# Patient Record
Sex: Female | Born: 1935 | Race: White | Hispanic: No | Smoking: Never smoker
Health system: Southern US, Community
[De-identification: ages and names within clinical notes are randomized; demographics above are authoritative.]

## PROBLEM LIST (undated history)

## (undated) DIAGNOSIS — I4891 Unspecified atrial fibrillation: Secondary | ICD-10-CM

## (undated) DIAGNOSIS — C50919 Malignant neoplasm of unspecified site of unspecified female breast: Secondary | ICD-10-CM

## (undated) DIAGNOSIS — I10 Essential (primary) hypertension: Secondary | ICD-10-CM

## (undated) DIAGNOSIS — E785 Hyperlipidemia, unspecified: Secondary | ICD-10-CM

## (undated) DIAGNOSIS — H409 Unspecified glaucoma: Secondary | ICD-10-CM

## (undated) HISTORY — PX: ABDOMINAL HYSTERECTOMY: SHX81

---

## 2001-04-20 DIAGNOSIS — C50919 Malignant neoplasm of unspecified site of unspecified female breast: Secondary | ICD-10-CM

## 2001-04-20 HISTORY — DX: Malignant neoplasm of unspecified site of unspecified female breast: C50.919

## 2001-04-20 HISTORY — PX: MASTECTOMY: SHX3

## 2007-05-01 ENCOUNTER — Ambulatory Visit: Payer: Self-pay | Admitting: Family Medicine

## 2009-06-09 ENCOUNTER — Ambulatory Visit: Payer: Self-pay | Admitting: Internal Medicine

## 2010-11-16 ENCOUNTER — Ambulatory Visit: Payer: Self-pay | Admitting: Internal Medicine

## 2011-02-16 ENCOUNTER — Inpatient Hospital Stay: Payer: Self-pay | Admitting: Specialist

## 2011-03-20 ENCOUNTER — Encounter: Payer: Self-pay | Admitting: Nurse Practitioner

## 2011-03-20 ENCOUNTER — Encounter: Payer: Self-pay | Admitting: Cardiothoracic Surgery

## 2011-04-17 ENCOUNTER — Encounter: Payer: Self-pay | Admitting: Cardiothoracic Surgery

## 2011-04-17 ENCOUNTER — Encounter: Payer: Self-pay | Admitting: Nurse Practitioner

## 2011-11-20 ENCOUNTER — Ambulatory Visit: Payer: Self-pay | Admitting: Internal Medicine

## 2012-11-20 ENCOUNTER — Ambulatory Visit: Payer: Self-pay | Admitting: Internal Medicine

## 2012-12-11 ENCOUNTER — Observation Stay: Payer: Self-pay | Admitting: Internal Medicine

## 2012-12-11 LAB — URINALYSIS, COMPLETE
Bacteria: NONE SEEN
Bilirubin,UR: NEGATIVE
Ketone: NEGATIVE
Protein: 100
Specific Gravity: 1.005 (ref 1.003–1.030)
WBC UR: 13 /HPF (ref 0–5)

## 2012-12-11 LAB — COMPREHENSIVE METABOLIC PANEL
Albumin: 3.5 g/dL (ref 3.4–5.0)
Anion Gap: 4 — ABNORMAL LOW (ref 7–16)
BUN: 17 mg/dL (ref 7–18)
Calcium, Total: 9.1 mg/dL (ref 8.5–10.1)
Co2: 32 mmol/L (ref 21–32)
Creatinine: 0.63 mg/dL (ref 0.60–1.30)
EGFR (African American): >60
EGFR (Non-African Amer.): >60
Glucose: 129 mg/dL — ABNORMAL HIGH (ref 65–99)
SGOT(AST): 30 U/L (ref 15–37)
SGPT (ALT): 25 U/L (ref 12–78)
Sodium: 135 mmol/L — ABNORMAL LOW (ref 136–145)
Total Protein: 8 g/dL (ref 6.4–8.2)

## 2012-12-11 LAB — CBC
HGB: 14.7 g/dL (ref 12.0–16.0)
MCH: 32.6 pg (ref 26.0–34.0)
MCHC: 33.8 g/dL (ref 32.0–36.0)
MCV: 96 fL (ref 80–100)
Platelet: 198 10*3/uL (ref 150–440)
RBC: 4.52 10*6/uL (ref 3.80–5.20)
RDW: 13.1 % (ref 11.5–14.5)
WBC: 9.2 10*3/uL (ref 3.6–11.0)

## 2012-12-11 LAB — PRO B NATRIURETIC PEPTIDE: B-Type Natriuretic Peptide: 1450 pg/mL — ABNORMAL HIGH (ref 0–450)

## 2012-12-12 LAB — LIPID PANEL
HDL Cholesterol: 46 mg/dL (ref 40–60)
Ldl Cholesterol, Calc: 64 mg/dL (ref 0–100)
VLDL Cholesterol, Calc: 17 mg/dL (ref 5–40)

## 2012-12-12 LAB — PROTIME-INR: Prothrombin Time: 25.3 secs — ABNORMAL HIGH (ref 11.5–14.7)

## 2012-12-12 LAB — TROPONIN I: Troponin-I: 0.11 ng/mL — ABNORMAL HIGH

## 2012-12-14 LAB — URINE CULTURE

## 2014-08-06 NOTE — Discharge Summary (Signed)
PATIENT NAME:  Kathryn [name], Kathryn [name] MR#:  [id number] DATE OF BIRTH:  [birth year]  PRIMARY CARE PHYSICIAN: Ezequiel Kayser, MD.  FINAL DIAGNOSES:  1.  Vertigo.  2.  Accelerated hypertension on presentation.  3.  Chronic atrial fibrillation.  4.  Hyperlipidemia.  5.  Elevated troponin.  6.  Glaucoma.   MEDICATIONS ON DISCHARGE: Include Klor-Con 20 mEq daily, multivitamin 1 tablet at bedtime, hydrochlorothiazide and lisinopril 12.5/20, one tablet daily, diltiazem 300 mg extended-release daily, digoxin 125 mcg daily, ferrous sulfate 325 mg twice a day, calcium with vitamin D 600 mg 200 international units 1 tablet daily, warfarin 2.5 mg one tablet on Tuesday, Thursday and Saturday and 5 mg on Monday, Wednesday, Friday and Sunday. Simvastatin 10 mg at bedtime, timolol ophthalmic solution 0.5% one drop each eye daily, latanoprost 0.005% one drop each eye at bedtime, ICaps 1 capsule twice a day, alendronate 70 mg once a week.  DIET: Low sodium diet, regular consistency.   ACTIVITY: As tolerated.   FOLLOW-UP: With Dr. Raechel Ache in 1 to 2 weeks.   The patient came in with dizziness, found to have elevated blood pressure.   LABORATORY AND RADIOLOGICAL DATA DURING HOSPITAL COURSE: Included a BNP of 1450. Troponin 0.09, glucose 129, BUN 17, creatinine 0.63, sodium 135, potassium 3.5, chloride 99, CO2 of 32, calcium 9.1. Liver function tests normal range. White blood cell count 9.2, H and H 14.7 and 43.5, platelet count 198.   Chest x-ray showed interstitial prominence, underlying component of pulmonary fibrosis.   Urinalysis shows + leukocyte esterase, negative nitrites.   CT scan of the head: No acute intracranial process.   Troponin borderline at 0.11. LDL 64, HDL 46, triglycerides 86.   MRA of the brain: No acute stenosis or aneurysm.   Troponin borderline at 0.11.   INR upon discharge 2.4.   HOSPITAL COURSE PER PROBLEM LIST:  1.  For the patient's vertigo, this had resolved pretty quickly, and the  patient did walk in the room with me without falling over and no further symptoms. No further work-up needed. This is not a stroke. This is not near syncope.  2.  Accelerated hypertension, probably because of the way she was feeling when she came in. Blood pressure better upon discharge on her usual medications. No changes in medications were made. Blood pressure 152/73.  3.  Chronic atrial fibrillation. Rate controlled with diltiazem and digoxin on Coumadin, and level is therapeutic.  4.  Hyperlipidemia. On simvastatin.  5.  Elevated troponin. No complaints of chest pain or shortness of breath. Unclear why her troponin was done in the first place. The patient was given an aspirin on admission. No need for beta blocker since the patient is already on 2 rate-controlling medications, diltiazem and digoxin, and I would not continue an aspirin in this patient since she is already on Coumadin. 6.  Glaucoma. The patient is on timolol and latanoprost.   TIME SPENT ON DISCHARGE: 35 minutes.    ____________________________ Tana Conch. Leslye Peer, MD rjw:np D: 12/12/2012 17:01:30 ET T: 12/12/2012 19:16:09 ET JOB#: [id number]  cc: Tana Conch. Leslye Peer, MD, <Dictator> Christena Flake. Raechel Ache, MD  Kathryn Brooklyn MD ELECTRONICALLY SIGNED 12/16/2012 12:20

## 2014-08-06 NOTE — H&P (Signed)
PATIENT NAME:  Kathryn [name], LORY MR#:  [id number] DATE OF BIRTH:  01-05-1919  DATE OF ADMISSION:  12/11/2012  PRIMARY CARE PHYSICIAN:  Dr. Ezequiel Kayser.  REFERRING PHYSICIAN:  Dr. Thomasene Lot.  HISTORY OF PRESENT ILLNESS:  Kathryn [name] is a 81 year old Caucasian female who currently resides at [facility] independent living facility with a past medical history significant for atrial fibrillation, hyperlipidemia and hypertension who is presenting after an episode of what she describes as dizziness occurring at 3:00 p.m. 12/11/2012.  She then went to sleep with resolution of symptoms.  Her symptoms reoccurred at 5:00 p.m.  She describes these episodes as her head is swimming and denies any other symptoms.  She denies the room is spinning or lightheadedness.  By the arrival to the Emergency Department all symptoms had resolved and she was found to have markedly elevated blood pressure with a systolic blood pressure ranging in the low 200s.  CT head was performed, revealed no acute findings, but did make mention of a possible intramural thrombus with recommendation to follow with a CTA or MRA.  Once again, currently Kathryn [name] is denying any further symptoms at this time.    REVIEW OF SYSTEMS:   CONSTITUTIONAL:  Denies any weakness or weight changes. EYES:  She does mention poor visual acuity which is chronic in nature.   EARS, NOSE, THROAT AND MOUTH:  She mentions difficulty of hearing.  This is chronic in nature.  Denies any oral lesions.  CARDIOVASCULAR:  Denies any chest pain, palpitations.  RESPIRATIONS:  Denies any shortness of breath, cough or wheeze.   GASTROINTESTINAL:  Denies any nausea, vomiting, diarrhea, constipation.  GENITOURINARY:  She is incontinent of urine on occasion. MUSCULOSKELETAL:  Denies any pain or weakness.  SKIN:  Denies any rash or lesions.  NEUROLOGIC:  She did mention the episode of dizziness, however has now resolved.  PSYCHIATRIC:  Denies any homicidal or suicidal ideation.  No  depressive symptoms.  ENDOCRINE:  Denies any fatigue. HEMATOLOGY AND LYMPHATIC:  Denies easy bruisability or bleeding.  ALLERGY AND IMMUNOLOGY:  No active symptoms. Otherwise full review of systems performed by me is negative.   PAST MEDICAL HISTORY:  Atrial fibrillation, hyperlipidemia, hypertension.  FAMILY HISTORY:  Hypertension and cardiovascular disease.   SOCIAL HISTORY:  Currently resides at [facility] independent living facility, is fairly functional in all activities of daily living.  Her son does however help her manage her medications.  Denies any alcohol, tobacco or drug usage.    ALLERGIES:  No known drug allergies.   HOME MEDICATIONS:  Alendronate 70 mg by mouth weekly, calcium plus vitamin D 600/200 once daily, digoxin 125 mg by mouth daily, Cardizem 300 mg extended release by mouth daily, Feosol 325 twice daily, hydrochlorothiazide/lisinopril 12.5/20, Latanoprost ophthalmic solution 0.005 each eye at bedtime, multivitamin, simvastatin 10 mg by mouth at bedtime, Timolol 0.5% ophthalmic solution each eye daily, warfarin 2.5 mg by mouth at bedtime Tuesday, Thursday, Saturday, warfarin 5 mg by mouth at bedtime Monday, Wednesday, Friday, Sunday.    PHYSICAL EXAMINATION:  VITAL SIGNS:  Temperature 98.2, heart rate 79, respirations 20, blood pressure 202/82, saturating 95% on 2 liters nasal cannula.  GENERAL:  No acute distress, awake, alert and oriented x 3.   HEENT:  Normocephalic, atraumatic.  Extraocular muscles intact.  Pupils equal, round, reactive to light as well as accommodation.  Moist mucosal membranes.  CARDIOVASCULAR:  S1, S2, irregular rate and rhythm, consistent with atrial fibrillation.  No murmurs, rubs or gallops.   PULMONARY:  Clear to auscultation bilaterally without wheezes, rales or rhonchi.  ABDOMEN:  Soft, nontender, nondistended with positive bowel sounds.  EXTREMITIES:  Reveal no cyanosis, edema or clubbing. NEUROLOGIC:  Cranial nerves II through XII are  intact.  No pronator drift.  Full strength and sensation.  Finger to nose intact.  Gait and Romberg not tested.   LABORATORY DATA:  Sodium 135, potassium 3.5, chloride 99, bicarb 32, BUN 17, creatinine 0.63, glucose 129, BNP 1450.  Troponin I of 0.09.  WBC 9.2, hemoglobin 14.7, hematocrit 43.5, platelets 198.  Urinalysis 1+ leukocyte esterase, 3+ blood, nitrite negative.  CT negative for acute findings though there is mention of a lesion which may be consistent with an intramural thrombus, however calcification is also a possibility.    ASSESSMENT AND PLAN:  Kathryn [name] is a 81 year old Caucasian female who resides at [facility] independent living facility with a history of atrial fibrillation, hyperlipidemia, hypertension, presenting after an episode of dizziness which she best describes as her head was swimming.  This originally occurred at 3:00 p.m. prior to admission and then reoccurred at 5:00 p.m.  All symptoms had resolved by presentation to the Emergency Department.  She is found to have a markedly elevated blood pressure with systolic blood pressures in the low 200s.  CT head was performed which revealed no acute findings, however it did mention possible intramural thrombus.  1.  Presyncope with concern for transient ischemic attack.  We will admit observation under transient ischemic attack protocol including neuro checks, aspirin which was given as well as a statin, checking lipids for risk factor modification.  2.  Hypertension.  Add as needed hydralazine to achieve better control with transient ischemic attack-like symptoms goal systolic blood pressures around 180.  3.  Possible intramural thrombus.  We will check an MRA in the morning.  4.  Atrial fibrillation.  Check an INR.  Continue with warfarin, digoxin and Cardizem as she is currently rate-controlled and anticoagulated.  5.  Hyperlipidemia.  Continue with statin therapy.    Total time spent 36 minutes.     ____________________________ Aaron Mose. Hower, MD dkh:ea D: 12/12/2012 04:17:29 ET T: 12/12/2012 06:24:00 ET JOB#: [id number]  cc: Aaron Mose. Hower, MD, <Dictator> DAVID Woodfin Ganja MD ELECTRONICALLY SIGNED 12/13/2012 1:56

## 2014-10-26 ENCOUNTER — Other Ambulatory Visit: Payer: Self-pay | Admitting: Internal Medicine

## 2014-10-26 DIAGNOSIS — C50912 Malignant neoplasm of unspecified site of left female breast: Secondary | ICD-10-CM

## 2014-10-26 DIAGNOSIS — Z1231 Encounter for screening mammogram for malignant neoplasm of breast: Secondary | ICD-10-CM

## 2015-04-14 ENCOUNTER — Other Ambulatory Visit: Payer: Self-pay | Admitting: Internal Medicine

## 2015-04-14 DIAGNOSIS — C50919 Malignant neoplasm of unspecified site of unspecified female breast: Secondary | ICD-10-CM

## 2015-04-14 DIAGNOSIS — Z1231 Encounter for screening mammogram for malignant neoplasm of breast: Secondary | ICD-10-CM

## 2015-04-15 ENCOUNTER — Ambulatory Visit
Admission: RE | Admit: 2015-04-15 | Discharge: 2015-04-15 | Disposition: A | Discharge status: Home / Self Care | Payer: Medicare Other | Source: Ambulatory Visit | Attending: Internal Medicine | Admitting: Internal Medicine

## 2015-04-15 DIAGNOSIS — C50919 Malignant neoplasm of unspecified site of unspecified female breast: Secondary | ICD-10-CM

## 2015-04-15 DIAGNOSIS — Z1231 Encounter for screening mammogram for malignant neoplasm of breast: Secondary | ICD-10-CM

## 2015-04-15 HISTORY — DX: Malignant neoplasm of unspecified site of unspecified female breast: C50.919

## 2016-10-18 ENCOUNTER — Emergency Department: Payer: Medicare Other

## 2016-10-18 ENCOUNTER — Observation Stay
Admission: EM | Admit: 2016-10-18 | Discharge: 2016-10-19 | Disposition: A | Discharge status: Home / Self Care | Payer: Medicare Other | Attending: Internal Medicine | Admitting: Internal Medicine

## 2016-10-18 ENCOUNTER — Encounter: Payer: Self-pay | Admitting: Emergency Medicine

## 2016-10-18 DIAGNOSIS — J8410 Pulmonary fibrosis, unspecified: Secondary | ICD-10-CM | POA: Insufficient documentation

## 2016-10-18 DIAGNOSIS — I10 Essential (primary) hypertension: Secondary | ICD-10-CM | POA: Insufficient documentation

## 2016-10-18 DIAGNOSIS — I700 Atherosclerosis of aorta: Secondary | ICD-10-CM | POA: Diagnosis not present

## 2016-10-18 DIAGNOSIS — Y929 Unspecified place or not applicable: Secondary | ICD-10-CM | POA: Diagnosis not present

## 2016-10-18 DIAGNOSIS — E785 Hyperlipidemia, unspecified: Secondary | ICD-10-CM | POA: Diagnosis not present

## 2016-10-18 DIAGNOSIS — Z79899 Other long term (current) drug therapy: Secondary | ICD-10-CM | POA: Diagnosis not present

## 2016-10-18 DIAGNOSIS — J9601 Acute respiratory failure with hypoxia: Secondary | ICD-10-CM | POA: Diagnosis not present

## 2016-10-18 DIAGNOSIS — W010XXA Fall on same level from slipping, tripping and stumbling without subsequent striking against object, initial encounter: Secondary | ICD-10-CM | POA: Diagnosis not present

## 2016-10-18 DIAGNOSIS — S0083XA Contusion of other part of head, initial encounter: Secondary | ICD-10-CM

## 2016-10-18 DIAGNOSIS — R0602 Shortness of breath: Secondary | ICD-10-CM | POA: Diagnosis not present

## 2016-10-18 DIAGNOSIS — R0902 Hypoxemia: Secondary | ICD-10-CM | POA: Diagnosis present

## 2016-10-18 DIAGNOSIS — I480 Paroxysmal atrial fibrillation: Secondary | ICD-10-CM | POA: Diagnosis not present

## 2016-10-18 DIAGNOSIS — Z8673 Personal history of transient ischemic attack (TIA), and cerebral infarction without residual deficits: Secondary | ICD-10-CM | POA: Insufficient documentation

## 2016-10-18 DIAGNOSIS — Z66 Do not resuscitate: Secondary | ICD-10-CM | POA: Diagnosis not present

## 2016-10-18 DIAGNOSIS — Z853 Personal history of malignant neoplasm of breast: Secondary | ICD-10-CM | POA: Diagnosis not present

## 2016-10-18 DIAGNOSIS — R269 Unspecified abnormalities of gait and mobility: Secondary | ICD-10-CM | POA: Diagnosis not present

## 2016-10-18 DIAGNOSIS — Z7901 Long term (current) use of anticoagulants: Secondary | ICD-10-CM | POA: Insufficient documentation

## 2016-10-18 HISTORY — DX: Hyperlipidemia, unspecified: E78.5

## 2016-10-18 HISTORY — DX: Essential (primary) hypertension: I10

## 2016-10-18 HISTORY — DX: Unspecified atrial fibrillation: I48.91

## 2016-10-18 LAB — CBC WITH DIFFERENTIAL/PLATELET
BASOS PCT: 0 %
Basophils Absolute: 0 10*3/uL (ref 0–0.1)
Eosinophils Absolute: 0.1 10*3/uL (ref 0–0.7)
Eosinophils Relative: 2 %
HEMATOCRIT: 41.1 % (ref 35.0–47.0)
HEMOGLOBIN: 13.8 g/dL (ref 12.0–16.0)
LYMPHS PCT: 11 %
Lymphs Abs: 1 10*3/uL (ref 1.0–3.6)
MCH: 32.4 pg (ref 26.0–34.0)
MCHC: 33.5 g/dL (ref 32.0–36.0)
MCV: 96.8 fL (ref 80.0–100.0)
MONOS PCT: 9 %
Monocytes Absolute: 0.8 10*3/uL (ref 0.2–0.9)
NEUTROS ABS: 7.1 10*3/uL — AB (ref 1.4–6.5)
Neutrophils Relative %: 78 %
Platelets: 169 10*3/uL (ref 150–440)
RBC: 4.25 MIL/uL (ref 3.80–5.20)
RDW: 13.6 % (ref 11.5–14.5)
WBC: 9.1 10*3/uL (ref 3.6–11.0)

## 2016-10-18 LAB — URINALYSIS, COMPLETE (UACMP) WITH MICROSCOPIC
Bilirubin Urine: NEGATIVE
GLUCOSE, UA: NEGATIVE mg/dL
Ketones, ur: NEGATIVE mg/dL
NITRITE: NEGATIVE
PH: 7 (ref 5.0–8.0)
PROTEIN: NEGATIVE mg/dL
SPECIFIC GRAVITY, URINE: 1.01 (ref 1.005–1.030)

## 2016-10-18 LAB — BASIC METABOLIC PANEL
ANION GAP: 8 (ref 5–15)
BUN: 24 mg/dL — ABNORMAL HIGH (ref 6–20)
CHLORIDE: 102 mmol/L (ref 101–111)
CO2: 30 mmol/L (ref 22–32)
Calcium: 9.3 mg/dL (ref 8.9–10.3)
Creatinine, Ser: 0.75 mg/dL (ref 0.44–1.00)
GFR calc Af Amer: >60 mL/min (ref >60)
GLUCOSE: 112 mg/dL — AB (ref 65–99)
POTASSIUM: 3.6 mmol/L (ref 3.5–5.1)
Sodium: 140 mmol/L (ref 135–145)

## 2016-10-18 LAB — PROTIME-INR
INR: 2.11
PROTHROMBIN TIME: 24 s — AB (ref 11.4–15.2)

## 2016-10-18 LAB — TROPONIN I

## 2016-10-18 LAB — MRSA PCR SCREENING: MRSA by PCR: NEGATIVE

## 2016-10-18 LAB — BRAIN NATRIURETIC PEPTIDE: B Natriuretic Peptide: 121 pg/mL — ABNORMAL HIGH (ref 0.0–100.0)

## 2016-10-18 MED ORDER — ALBUTEROL SULFATE (2.5 MG/3ML) 0.083% IN NEBU: 2.5000 mg | INHALATION_SOLUTION | RESPIRATORY_TRACT | Status: DC | PRN

## 2016-10-18 MED ORDER — ACETAMINOPHEN 325 MG PO TABS: 650.0000 mg | ORAL_TABLET | Freq: Four times a day (QID) | ORAL | Status: DC | PRN

## 2016-10-18 MED ORDER — LISINOPRIL-HYDROCHLOROTHIAZIDE 20-12.5 MG PO TABS: 1.0000 | ORAL_TABLET | Freq: Every day | ORAL | Status: DC

## 2016-10-18 MED ORDER — WARFARIN - PHYSICIAN DOSING INPATIENT: Freq: Every day | Status: DC

## 2016-10-18 MED ORDER — FERROUS SULFATE 325 (65 FE) MG PO TABS
325.0000 mg | ORAL_TABLET | Freq: Two times a day (BID) | ORAL | Status: DC
  Administered 2016-10-18 – 2016-10-19 (×2): 325 mg via ORAL
  Filled 2016-10-18 (×2): qty 1

## 2016-10-18 MED ORDER — FUROSEMIDE 10 MG/ML IJ SOLN
40.0000 mg | Freq: Once | INTRAMUSCULAR | Status: AC
  Administered 2016-10-18: 40 mg via INTRAVENOUS
  Filled 2016-10-18: qty 4

## 2016-10-18 MED ORDER — DIGOXIN 125 MCG PO TABS
0.1250 mg | ORAL_TABLET | Freq: Every day | ORAL | Status: DC
  Administered 2016-10-19: 0.125 mg via ORAL
  Filled 2016-10-18: qty 1

## 2016-10-18 MED ORDER — WARFARIN SODIUM 2.5 MG PO TABS
2.5000 mg | ORAL_TABLET | ORAL | Status: DC
  Administered 2016-10-18: 2.5 mg via ORAL
  Filled 2016-10-18: qty 1

## 2016-10-18 MED ORDER — SIMVASTATIN 20 MG PO TABS
10.0000 mg | ORAL_TABLET | Freq: Every day | ORAL | Status: DC
  Administered 2016-10-18: 10 mg via ORAL
  Filled 2016-10-18: qty 1

## 2016-10-18 MED ORDER — HYDROCHLOROTHIAZIDE 12.5 MG PO CAPS
12.5000 mg | ORAL_CAPSULE | Freq: Every day | ORAL | Status: DC
  Administered 2016-10-19: 12.5 mg via ORAL
  Filled 2016-10-18: qty 1

## 2016-10-18 MED ORDER — IPRATROPIUM-ALBUTEROL 0.5-2.5 (3) MG/3ML IN SOLN
3.0000 mL | Freq: Once | RESPIRATORY_TRACT | Status: AC
  Administered 2016-10-18: 3 mL via RESPIRATORY_TRACT
  Filled 2016-10-18: qty 3

## 2016-10-18 MED ORDER — ONDANSETRON HCL 4 MG/2ML IJ SOLN: 4.0000 mg | Freq: Four times a day (QID) | INTRAMUSCULAR | Status: DC | PRN

## 2016-10-18 MED ORDER — ACETAMINOPHEN 650 MG RE SUPP: 650.0000 mg | Freq: Four times a day (QID) | RECTAL | Status: DC | PRN

## 2016-10-18 MED ORDER — LISINOPRIL 20 MG PO TABS
20.0000 mg | ORAL_TABLET | Freq: Every day | ORAL | Status: DC
  Administered 2016-10-19: 20 mg via ORAL
  Filled 2016-10-18: qty 1

## 2016-10-18 MED ORDER — DILTIAZEM HCL ER COATED BEADS 300 MG PO CP24
300.0000 mg | ORAL_CAPSULE | Freq: Every day | ORAL | Status: DC
  Administered 2016-10-19: 300 mg via ORAL
  Filled 2016-10-18: qty 1

## 2016-10-18 MED ORDER — ONDANSETRON HCL 4 MG PO TABS
4.0000 mg | ORAL_TABLET | Freq: Four times a day (QID) | ORAL | Status: DC | PRN
  Administered 2016-10-19: 4 mg via ORAL
  Filled 2016-10-18: qty 1

## 2016-10-18 MED ORDER — LATANOPROST 0.005 % OP SOLN
1.0000 [drp] | Freq: Every day | OPHTHALMIC | Status: DC
  Administered 2016-10-18: 1 [drp] via OPHTHALMIC
  Filled 2016-10-18: qty 2.5

## 2016-10-18 MED ORDER — TIMOLOL MALEATE 0.5 % OP SOLN
1.0000 [drp] | Freq: Every morning | OPHTHALMIC | Status: DC
  Administered 2016-10-19: 1 [drp] via OPHTHALMIC
  Filled 2016-10-18: qty 5

## 2016-10-18 MED ORDER — CALCIUM CARBONATE ANTACID 500 MG PO CHEW
3.0000 | CHEWABLE_TABLET | Freq: Every day | ORAL | Status: DC
  Administered 2016-10-19: 600 mg via ORAL
  Filled 2016-10-18: qty 3

## 2016-10-18 MED ORDER — WARFARIN SODIUM 5 MG PO TABS
5.0000 mg | ORAL_TABLET | ORAL | Status: DC
  Filled 2016-10-18: qty 1

## 2016-10-18 MED ORDER — POLYETHYLENE GLYCOL 3350 17 G PO PACK: 17.0000 g | PACK | Freq: Every day | ORAL | Status: DC | PRN

## 2016-10-18 NOTE — ED Notes (Signed)
Pt take off O2 to see how she tolerates.

## 2016-10-18 NOTE — Progress Notes (Signed)
Spoke with Dr. Posey Pronto. Pt does not have any of her home medications ordered. MD acknowledge.

## 2016-10-18 NOTE — ED Triage Notes (Addendum)
Pt to ED via [facility] from [facility] for a fall. Per EMS pt was getting up out of chair and was not using her walker when she fell and hit her head. Pt noted to have hematoma on right forehead. No other injuries noted, pt denies pain any where else. Pt is A & O on assessment.   Pt states that she is on blood thinners

## 2016-10-18 NOTE — H&P (Signed)
[place] at [place] NAME: Kathryn [name]    MR#:  [id number]  DATE OF BIRTH:  [birth year]  DATE OF ADMISSION:  10/18/2016  PRIMARY CARE PHYSICIAN: Ezequiel Kayser, MD   REQUESTING/REFERRING PHYSICIAN: Dr. Reita Cliche  CHIEF COMPLAINT:   Chief Complaint  Patient presents with  . Fall    HISTORY OF PRESENT ILLNESS:  Kathryn [name]  is a 83 y.o. female with a known history of Atrial fibrillation, hypertension who walks with a walker at baseline ventricular the emergency room after she tripped and fell. Patient was concerned as she is on Coumadin for atrial fibrillation. A CT scan of the head was done which showed nothing acute. Patient has had episodes of oxygen saturations going as low as 84% sustained. She has not been on oxygen in the past other than what she was in the hospital. Patient is being admitted for new diagnosis of hypoxemia for further workup. Chest x-ray shows interstitial changes and pulmonary fibrosis. She does have chronic mild lower extremity edema. No orthopnea. Never smoked. BNP mildly elevated at 121. Patient is on 1 L oxygen at this time.  PAST MEDICAL HISTORY:   Past Medical History:  Diagnosis Date  . A-fib ([place])   . Breast cancer ([place]) 04/20/2001   lt mastecomy  . Hyperlipidemia   . Hypertension     PAST SURGICAL HISTORY:   Past Surgical History:  Procedure Laterality Date  . ABDOMINAL HYSTERECTOMY    . MASTECTOMY Left 04/20/01    SOCIAL HISTORY:   Social History  Substance Use Topics  . Smoking status: Never Smoker  . Smokeless tobacco: Never Used  . Alcohol use No    FAMILY HISTORY:   Family History  Problem Relation Age of Onset  . Breast cancer Sister   . Throat cancer Father     DRUG ALLERGIES:  No Known Allergies  REVIEW OF SYSTEMS:   Review of Systems  Constitutional: Negative for chills and fever.  HENT: Negative for sore throat.   Eyes: Negative for blurred vision, double vision and pain.   Respiratory: Negative for cough, hemoptysis, shortness of breath and wheezing.   Cardiovascular: Negative for chest pain, palpitations, orthopnea and leg swelling.  Gastrointestinal: Negative for abdominal pain, constipation, diarrhea, heartburn, nausea and vomiting.  Genitourinary: Negative for dysuria and hematuria.  Musculoskeletal: Positive for falls. Negative for back pain and joint pain.  Skin: Negative for rash.  Neurological: Negative for sensory change, speech change, focal weakness and headaches.  Endo/Heme/Allergies: Does not bruise/bleed easily.  Psychiatric/Behavioral: Negative for depression. The patient is not nervous/anxious.     MEDICATIONS AT HOME:   Prior to Admission medications   Medication Sig Start Date End Date Taking? Authorizing Provider  calcium carbonate (CALCIUM 600) 1500 (600 Ca) MG TABS tablet Take 1 tablet by mouth daily.   Yes [provider]  digoxin (LANOXIN) 0.125 MG tablet Take 0.125 mg by mouth daily. 04/23/16  Yes [provider]  diltiazem (CARDIZEM CD) 300 MG 24 hr capsule Take 300 mg by mouth daily. 10/27/15  Yes [provider]  ferrous sulfate 325 (65 FE) MG tablet Take 325 mg by mouth 2 (two) times daily.   Yes [provider]  latanoprost (XALATAN) 0.005 % ophthalmic solution Place 1 drop into both eyes at bedtime. 09/20/13  Yes [provider]  lisinopril-hydrochlorothiazide (PRINZIDE,ZESTORETIC) 20-12.5 MG tablet Take 1 tablet by mouth daily. 04/23/16  Yes [provider]  Multiple Vitamin (MULTI-VITAMINS) TABS Take  1 tablet by mouth daily.   Yes [provider]  Multiple Vitamins-Minerals (PRESERVISION AREDS PO) Take 1 capsule by mouth 2 (two) times daily.   Yes [provider]  potassium chloride SA (K-DUR,KLOR-CON) 20 MEQ tablet Take 20 mEq by mouth daily. 04/23/16  Yes [provider]  simvastatin (ZOCOR) 10 MG tablet Take 10 mg by mouth at bedtime. 04/23/16  Yes  [provider]  timolol (TIMOPTIC) 0.5 % ophthalmic solution Place 1 drop into both eyes every morning. 09/20/13  Yes [provider]  warfarin (COUMADIN) 2.5 MG tablet Take 2.5-5 mg by mouth See admin instructions. Take 2.5 mg by mouth on Tuesday, Thursday, Saturday and Sunday. Take 5 mg by mouth on Monday, Wednesday and Friday. 04/23/16  Yes [provider]     VITAL SIGNS:  Blood pressure (!) 162/69, pulse 69, temperature 98 F (36.7 C), temperature source Oral, resp. rate (!) 25, SpO2 95 %.  PHYSICAL EXAMINATION:  Physical Exam  GENERAL:  83 y.o.-year-old patient lying in the bed with no acute distress.  EYES: Pupils equal, round, reactive to light and accommodation. No scleral icterus. Extraocular muscles intact.  HEENT: Head atraumatic, normocephalic. Oropharynx and nasopharynx clear. No oropharyngeal erythema, moist oral mucosa  NECK:  Supple, no jugular venous distention. No thyroid enlargement, no tenderness.  LUNGS: Bilateral expiratory and inspiratory crackles CARDIOVASCULAR: S1, S2 normal. No murmurs, rubs, or gallops.  ABDOMEN: Soft, nontender, nondistended. Bowel sounds present. No organomegaly or mass.  EXTREMITIES: No pedal edema, cyanosis, or clubbing. + 2 pedal & radial pulses b/l.   NEUROLOGIC: Cranial nerves II through XII are intact. No focal Motor or sensory deficits appreciated b/l PSYCHIATRIC: The patient is alert and oriented x 3. Good affect.  SKIN: No obvious rash, lesion, or ulcer.   LABORATORY PANEL:   CBC  Recent Labs Lab 10/18/16 1319  WBC 9.1  HGB 13.8  HCT 41.1  PLT 169   ------------------------------------------------------------------------------------------------------------------  Chemistries   Recent Labs Lab 10/18/16 1319  NA 140  K 3.6  CL 102  CO2 30  GLUCOSE 112*  BUN 24*  CREATININE 0.75  CALCIUM 9.3    ------------------------------------------------------------------------------------------------------------------  Cardiac Enzymes  Recent Labs Lab 10/18/16 1319  TROPONINI <0.03   ------------------------------------------------------------------------------------------------------------------  RADIOLOGY:  Dg Chest 2 View  Result Date: 10/18/2016 CLINICAL DATA:  Hypoxia appear EXAM: CHEST  2 VIEW COMPARISON:  12/11/2012 FINDINGS: The cardiac silhouette is enlarged. Mediastinal contours appear intact. Calcific atherosclerotic disease and tortuosity of the aorta. There is no evidence of focal airspace consolidation, pleural effusion or pneumothorax. Chronic coarsening of the interstitium. Bilateral full thickening and calcifications noted. Severe osteoarthritic changes of bilateral glenohumeral joints. Soft tissues are grossly normal. IMPRESSION: Enlarged cardiac silhouette. Calcific atherosclerotic disease of the aorta. Chronic interstitial lung changes, with bilateral pleural calcifications. Electronically Signed   By: Fidela Salisbury M.D.   On: 10/18/2016 13:21   Ct Head Wo Contrast  Result Date: 10/18/2016 CLINICAL DATA:  Recent fall EXAM: CT HEAD WITHOUT CONTRAST TECHNIQUE: Contiguous axial images were obtained from the base of the skull through the vertex without intravenous contrast. COMPARISON:  12/11/2012 FINDINGS: Brain: Mild atrophic changes are noted. Chronic white matter ischemic change is seen. 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 No findings to suggest acute hemorrhage, acute infarction or space-occupying mass lesion are noted. Vascular: No hyperdense vessel or unexpected calcification. Skull: Normal. Negative for fracture or focal lesion. Sinuses/Orbits: No acute finding. Other: Right forehead hematoma is noted. IMPRESSION: Chronic changes without  acute abnormality. Electronically Signed   By: Inez Catalina M.D.   On: 10/18/2016 12:10     IMPRESSION AND PLAN:   *  Acute hypoxic respiratory failure. Patient has pulmonary fibrosis on chest x-ray which is the likely cause. Does have crackles on examination with mild elevation in BNP. It is unclear if she has any CHF or the crackles or with primary fibrosis. We will start insulin as per meter. Give her 1 dose of IV Lasix and DuoNeb nebulizer. Monitor oxygen. Ambulatory pulse ox. Monitor overnight. Patient does not have any shortness of breath. If there are no other acute events patient can be discharged home tomorrow with home oxygen. Depending on response to IV Lasix may need Lasix at discharge.  * Fall due to gait abnormalities. Chronic. Will have physical therapy evaluate. No trauma. CT scan of the head shows nothing acute.  * Paroxysmal atrial fibrillation. Continue home medications.  *  Hypertension. Continue medication.  DVT prophylaxis. Patient is on Coumadin.  All the records are reviewed and case discussed with ED provider. Management plans discussed with the patient, family and they are in agreement.  CODE STATUS: DNR  TOTAL TIME TAKING CARE OF THIS PATIENT: 40 minutes.   Hillary Bow R M.D on 10/18/2016 at 3:12 PM  Between 7am to 6pm - Pager - [phone]  After 6pm go to [email or web address] - password [email or web address] [facility]  Office  [phone]  CC: Primary care physician; Ezequiel Kayser, MD  Note: This dictation was prepared with Dragon dictation along with smaller phrase technology. Any transcriptional errors that result from this process are unintentional.

## 2016-10-18 NOTE — ED Notes (Signed)
Spoke with Dr Posey Pronto about patient being incontinent and giving her lasix. Gave the verbal ok to place the [facility] Female External Catheter.

## 2016-10-18 NOTE — ED Provider Notes (Signed)
[facility] Emergency Department Provider Note ____________________________________________   I have reviewed the triage vital signs and the triage nursing note.  HISTORY  Chief Complaint Fall   Historian Patient  HPI Kathryn [name] is a 83 y.o. female from independent living at [facility], presenting for forehead hematoma after falling forward getting out of her recliner. She states that she just lost her balance. She states that she is on a blood thinner, but cannot recall the name of it. Denies neck pain. Denies losing consciousness. Denies extremity injuries. Denies chest or abdominal pain. Denies shortness of breath or coughing or fevers.  Denies urinary symptoms.    Past Medical History:  Diagnosis Date  . Breast cancer ([place]) 04/20/2001   lt mastecomy    There are no active problems to display for this patient.   Past Surgical History:  Procedure Laterality Date  . ABDOMINAL HYSTERECTOMY    . MASTECTOMY Left 04/20/01    Prior to Admission medications   Medication Sig Start Date End Date Taking? Authorizing Provider  calcium carbonate (CALCIUM 600) 1500 (600 Ca) MG TABS tablet Take 1 tablet by mouth daily.   Yes [provider]  digoxin (LANOXIN) 0.125 MG tablet Take 0.125 mg by mouth daily. 04/23/16  Yes [provider]  diltiazem (CARDIZEM CD) 300 MG 24 hr capsule Take 300 mg by mouth daily. 10/27/15  Yes [provider]  ferrous sulfate 325 (65 FE) MG tablet Take 325 mg by mouth 2 (two) times daily.   Yes [provider]  latanoprost (XALATAN) 0.005 % ophthalmic solution Place 1 drop into both eyes at bedtime. 09/20/13  Yes [provider]  lisinopril-hydrochlorothiazide (PRINZIDE,ZESTORETIC) 20-12.5 MG tablet Take 1 tablet by mouth daily. 04/23/16  Yes [provider]  Multiple Vitamin (MULTI-VITAMINS) TABS Take 1 tablet by mouth daily.   Yes [provider]  Multiple Vitamins-Minerals  (PRESERVISION AREDS PO) Take 1 capsule by mouth 2 (two) times daily.   Yes [provider]  potassium chloride SA (K-DUR,KLOR-CON) 20 MEQ tablet Take 20 mEq by mouth daily. 04/23/16  Yes [provider]  simvastatin (ZOCOR) 10 MG tablet Take 10 mg by mouth at bedtime. 04/23/16  Yes [provider]  timolol (TIMOPTIC) 0.5 % ophthalmic solution Place 1 drop into both eyes every morning. 09/20/13  Yes [provider]  warfarin (COUMADIN) 2.5 MG tablet Take 2.5-5 mg by mouth See admin instructions. Take 2.5 mg by mouth on Tuesday, Thursday, Saturday and Sunday. Take 5 mg by mouth on Monday, Wednesday and Friday. 04/23/16  Yes [provider]    No Known Allergies  Family History  Problem Relation Age of Onset  . Breast cancer Sister     Social History Social History  Substance Use Topics  . Smoking status: Never Smoker  . Smokeless tobacco: Never Used  . Alcohol use No    Review of Systems  Constitutional: Negative for fever. Eyes: Negative for visual changes. ENT: Negative for sore throat. Cardiovascular: Negative for chest pain. Respiratory: Negative for shortness of breath. Gastrointestinal: Negative for abdominal pain, vomiting and diarrhea. Genitourinary: Negative for dysuria. Musculoskeletal: Negative for back pain. Skin: Negative for rash. Neurological: Negative for headache.  ____________________________________________   PHYSICAL EXAM:  VITAL SIGNS: ED Triage Vitals [10/18/16 1157]  Enc Vitals Group     BP (!) 185/70     Pulse Rate 89     Resp 16     Temp 98 F (36.7 C)  Temp Source Oral     SpO2 (!) 87 %     Weight      Height      Head Circumference      Peak Flow      Pain Score      Pain Loc      Pain Edu?      Excl. in [place]?      Constitutional: Alert and Cooperative. Well appearing and in no distress. HEENT   Head: Normocephalic.  Hematoma right forehead.      Eyes: Conjunctivae are normal. Pupils  equal and round.       Ears:         Nose: No congestion/rhinnorhea.   Mouth/Throat: Mucous membranes are moist.   Neck: No stridor.  No posterior midline C-spine tenderness to palpation or range of motion. Cardiovascular/Chest: Normal rate, regular rhythm.  No murmurs, rubs, or gallops. Respiratory: Normal respiratory effort without tachypnea nor retractions. Mild rhonchi without wheezing. Gastrointestinal: Soft. No distention, no guarding, no rebound. Nontender.    Genitourinary/rectal:Deferred Musculoskeletal: Nontender with normal range of motion in all extremities. No joint effusions.  No lower extremity tenderness.  No edema. Neurologic:  Normal speech and language. No gross or focal neurologic deficits are appreciated. Skin:  Skin is warm, dry and intact. No rash noted. Psychiatric: Mood and affect are normal. Speech and behavior are normal. Patient exhibits appropriate insight and judgment.   ____________________________________________  LABS (pertinent positives/negatives)  Labs Reviewed  BASIC METABOLIC PANEL - Abnormal; Notable for the following:       Result Value   Glucose, Bld 112 (*)    BUN 24 (*)    All other components within normal limits  CBC WITH DIFFERENTIAL/PLATELET - Abnormal; Notable for the following:    Neutro Abs 7.1 (*)    All other components within normal limits  TROPONIN I  URINALYSIS, COMPLETE (UACMP) WITH MICROSCOPIC  BRAIN NATRIURETIC PEPTIDE  PROTIME-INR    ____________________________________________    EKG I, Lisa Roca, MD, the attending physician have personally viewed and interpreted all ECGs.  77 beats per minute. Undetermined rhythm but suspect atrial fibrillation. Narrow QRS. Normal axis. Nonspecific ST and T-wave ____________________________________________  RADIOLOGY All Xrays were viewed by me. Imaging interpreted by Radiologist.  CT without contrast:  IMPRESSION: Chronic changes without acute  abnormality.    Chest x-ray two-view:  IMPRESSION: Enlarged cardiac silhouette.  Calcific atherosclerotic disease of the aorta.  Chronic interstitial lung changes, with bilateral pleural calcifications. __________________________________________  PROCEDURES  Procedure(s) performed: None  Critical Care performed: None  ____________________________________________   ED COURSE / ASSESSMENT AND PLAN  Pertinent labs & imaging results that were available during my care of the patient were reviewed by me and considered in my medical decision making (see chart for details).   Ms. Dematteo came in from independent living with what sounded like off balance tipping over and striking her head without loss of consciousness or other traumatic injury.  Patient was found to be hypoxic, initially 90% on room air. Patient is a bit of a story her son got here he confirmed that she has no underlying known hypoxia.  She did at times dropped down to 84% with a good waveform. She denies coughing or shortness of breath or chest pain. However this point time with the hypoxia, I am going to add on laboratory evaluation, chest x-ray, EKG, and need to admit this patient for workup of her hypoxia and she'll likely need to be discharged home  with oxygen.  Chest x-ray shows no focal findings, but chronic interstitial lung findings.  Laboratory studies are all reassuring. No elevated white blood cell count.  No pleuritic chest pain.  Doubt PE.  Suspect some hypoventilation contribution and cxr shows some chronic fibrosis.  In any case she will need to be set up for home o2 as she's from independent living.   CONSULTATIONS:  Hospitalist for admission.   Patient / Family / Caregiver informed of clinical course, medical decision-making process, and agree with plan.  __________________________________________   FINAL CLINICAL IMPRESSION(S) / ED DIAGNOSES   Final diagnoses:  Facial hematoma, initial  encounter  Hypoxia              Note: This dictation was prepared with Dragon dictation. Any transcriptional errors that result from this process are unintentional    Lisa Roca, MD 10/18/16 1409

## 2016-10-18 NOTE — ED Notes (Signed)
Pt returned from CT, head of patients bed elevated, O2 decreased to 1 L via [place]. Pt tolerating at this time with O2 sat at 94-95%

## 2016-10-18 NOTE — ED Notes (Signed)
Patients O2 level did dip back down in the upper 80's. This RN went into room and patient was messing with the SPO2 probe on her finger. Increased O2 to 2L. Patient now at 99%.

## 2016-10-18 NOTE — ED Notes (Signed)
Pt O2 sats dropped to 85-86% on room air. Patient moved to room 26 to be placed on the monitor and on O2. Patient placed on 2 liter via [place] with sats improving to 93-94%.

## 2016-10-18 NOTE — ED Notes (Signed)
Pt assisted to restroom by RN. RN noticed that pt had black stool when she wiped. Dr. Reita Cliche informed.

## 2016-10-19 DIAGNOSIS — J9601 Acute respiratory failure with hypoxia: Secondary | ICD-10-CM | POA: Diagnosis not present

## 2016-10-19 LAB — BASIC METABOLIC PANEL
Anion gap: 9 (ref 5–15)
BUN: 17 mg/dL (ref 6–20)
CHLORIDE: 99 mmol/L — AB (ref 101–111)
CO2: 33 mmol/L — ABNORMAL HIGH (ref 22–32)
Calcium: 8.7 mg/dL — ABNORMAL LOW (ref 8.9–10.3)
Creatinine, Ser: 0.62 mg/dL (ref 0.44–1.00)
Glucose, Bld: 104 mg/dL — ABNORMAL HIGH (ref 65–99)
POTASSIUM: 3 mmol/L — AB (ref 3.5–5.1)
SODIUM: 141 mmol/L (ref 135–145)

## 2016-10-19 LAB — PROTIME-INR
INR: 2.03
PROTHROMBIN TIME: 23.3 s — AB (ref 11.4–15.2)

## 2016-10-19 MED ORDER — POTASSIUM CHLORIDE CRYS ER 20 MEQ PO TBCR
30.0000 meq | EXTENDED_RELEASE_TABLET | ORAL | Status: AC
  Administered 2016-10-19 (×2): 30 meq via ORAL
  Filled 2016-10-19 (×2): qty 1

## 2016-10-19 NOTE — Care Management Obs Status (Signed)
[facility] NOTIFICATION   Patient Details  Name: Kathryn [name] MRN: [id number] Date of Birth: 1918/05/15   Medicare Observation Status Notification Given:  Yes    Jolly Mango, RN 10/19/2016, 9:17 AM

## 2016-10-19 NOTE — Discharge Summary (Signed)
[facility] [place] NAME: Kathryn [name]    MR#:  [id number]  DATE OF BIRTH:  09/05/1918  DATE OF ADMISSION:  10/18/2016 ADMITTING PHYSICIAN: Hillary Bow, MD  DATE OF DISCHARGE: 10/19/2016  PRIMARY CARE PHYSICIAN: Ezequiel Kayser, MD    ADMISSION DIAGNOSIS:  Hypoxia [R09.02] Facial hematoma, initial encounter [S00.83XA]  DISCHARGE DIAGNOSIS:  Transient Hypoxia resolved Chronic interstitial fibrosis (on CXR) Fall with right forehead bruise (CT head neg)  SECONDARY DIAGNOSIS:   Past Medical History:  Diagnosis Date  . A-fib (Kathryn [name])   . Breast cancer ([place]) 04/20/2001   lt mastecomy  . Hyperlipidemia   . Hypertension     HOSPITAL COURSE:  Kathryn [name]  is a 83 y.o. female with a known history of Atrial fibrillation, hypertension who walks with a walker at baseline ventricular the emergency room after she tripped and fell. Patient was concerned as she is on Coumadin for atrial fibrillation. A CT scan of the head was done which showed nothing acute. Patient has had episodes of oxygen saturations going as low as 84% sustained. She has not been on oxygen in the past other than what she was in the hospital.   * Acute hypoxic respiratory failure. Patient has pulmonary fibrosis on chest x-ray which is the likely cause.  -received  1 dose of IV Lasix and DuoNeb nebulizer.  -Patient's saturations remained stable. She is more than 91% on ambulation with physical therapy. She denies any shortness of breath.  * Fall due to gait abnormalities. Chronic. Will have physical therapy evaluate. No trauma. CT scan of the head shows nothing acute. -PT recommends home health PT which we'll arrange.  * Paroxysmal atrial fibrillation. Continue home medications. -We'll continue Coumadin. INR therapeutic at 2.03  *  Hypertension. Continue medication.  DVT prophylaxis. Patient is on Coumadin.  Patient will discharge back to home with home health PT Try  to call son unable to get in touch with the phone  CONSULTS OBTAINED:    DRUG ALLERGIES:  No Known Allergies  DISCHARGE MEDICATIONS:   Current Discharge Medication List    CONTINUE these medications which have NOT CHANGED   Details  calcium carbonate (CALCIUM 600) 1500 (600 Ca) MG TABS tablet Take 1 tablet by mouth daily.    digoxin (LANOXIN) 0.125 MG tablet Take 0.125 mg by mouth daily.    diltiazem (CARDIZEM CD) 300 MG 24 hr capsule Take 300 mg by mouth daily.    ferrous sulfate 325 (65 FE) MG tablet Take 325 mg by mouth 2 (two) times daily.    latanoprost (XALATAN) 0.005 % ophthalmic solution Place 1 drop into both eyes at bedtime.    lisinopril-hydrochlorothiazide (PRINZIDE,ZESTORETIC) 20-12.5 MG tablet Take 1 tablet by mouth daily.    Multiple Vitamin (MULTI-VITAMINS) TABS Take 1 tablet by mouth daily.    Multiple Vitamins-Minerals (PRESERVISION AREDS PO) Take 1 capsule by mouth 2 (two) times daily.    potassium chloride SA (K-DUR,KLOR-CON) 20 MEQ tablet Take 20 mEq by mouth daily.    simvastatin (ZOCOR) 10 MG tablet Take 10 mg by mouth at bedtime.    timolol (TIMOPTIC) 0.5 % ophthalmic solution Place 1 drop into both eyes every morning.    warfarin (COUMADIN) 2.5 MG tablet Take 2.5-5 mg by mouth See admin instructions. Take 2.5 mg by mouth on Tuesday, Thursday, Saturday and Sunday. Take 5 mg by mouth on Monday, Wednesday and Friday.        If you experience worsening  of your admission symptoms, develop shortness of breath, life threatening emergency, suicidal or homicidal thoughts you must seek medical attention immediately by calling 911 or calling your MD immediately  if symptoms less severe.  You Must read complete instructions/literature along with all the possible adverse reactions/side effects for all the Medicines you take and that have been prescribed to you. Take any new Medicines after you have completely understood and accept all the possible adverse  reactions/side effects.   Please note  You were cared for by a hospitalist during your hospital stay. If you have any questions about your discharge medications or the care you received while you were in the hospital after you are discharged, you can call the unit and asked to speak with the hospitalist on call if the hospitalist that took care of you is not available. Once you are discharged, your primary care physician will handle any further medical issues. Please note that NO REFILLS for any discharge medications will be authorized once you are discharged, as it is imperative that you return to your primary care physician (or establish a relationship with a primary care physician if you do not have one) for your aftercare needs so that they can reassess your need for medications and monitor your lab values. Today   SUBJECTIVE   Abdomen the chair eating lunch denies any complaints  VITAL SIGNS:  Blood pressure (!) 150/59, pulse 67, temperature (!) 97.5 F (36.4 C), temperature source Oral, resp. rate 18, weight 60.8 kg (134 lb), SpO2 96 %.  I/O:   Intake/Output Summary (Last 24 hours) at 10/19/16 1325 Last data filed at 10/19/16 0935  Gross per 24 hour  Intake              120 ml  Output             1400 ml  Net            -1280 ml    PHYSICAL EXAMINATION:  GENERAL:  83 y.o.-year-old patient lying in the bed with no acute distress.  EYES: Pupils equal, round, reactive to light and accommodation. No scleral icterus. Extraocular muscles intact.  HEENT: Head atraumatic, normocephalic. Oropharynx and nasopharynx clear. Bruise over the right forehead NECK:  Supple, no jugular venous distention. No thyroid enlargement, no tenderness.  LUNGS: Normal breath sounds bilaterally, no wheezing, rales,rhonchi or crepitation. No use of accessory muscles of respiration.  CARDIOVASCULAR: S1, S2 normal. No murmurs, rubs, or gallops.  ABDOMEN: Soft, non-tender, non-distended. Bowel sounds present. No  organomegaly or mass.  EXTREMITIES: No pedal edema, cyanosis, or clubbing.  NEUROLOGIC: Cranial nerves II through XII are intact. Muscle strength 5/5 in all extremities. Sensation intact. Gait not checked.  PSYCHIATRIC: The patient is alert and oriented x 3.  SKIN: No obvious rash, lesion, or ulcer.   DATA REVIEW:   CBC   Recent Labs Lab 10/18/16 1319  WBC 9.1  HGB 13.8  HCT 41.1  PLT 169    Chemistries   Recent Labs Lab 10/19/16 0333  NA 141  K 3.0*  CL 99*  CO2 33*  GLUCOSE 104*  BUN 17  CREATININE 0.62  CALCIUM 8.7*    Microbiology Results   Recent Results (from the past 240 hour(s))  MRSA PCR Screening     Status: None   Collection Time: 10/18/16  9:38 PM  Result Value Ref Range Status   MRSA by PCR NEGATIVE NEGATIVE Final    Comment:  The GeneXpert MRSA Assay (FDA approved for NASAL specimens only), is one component of a comprehensive MRSA colonization surveillance program. It is not intended to diagnose MRSA infection nor to guide or monitor treatment for MRSA infections.     RADIOLOGY:  Dg Chest 2 View  Result Date: 10/18/2016 CLINICAL DATA:  Hypoxia appear EXAM: CHEST  2 VIEW COMPARISON:  12/11/2012 FINDINGS: The cardiac silhouette is enlarged. Mediastinal contours appear intact. Calcific atherosclerotic disease and tortuosity of the aorta. There is no evidence of focal airspace consolidation, pleural effusion or pneumothorax. Chronic coarsening of the interstitium. Bilateral full thickening and calcifications noted. Severe osteoarthritic changes of bilateral glenohumeral joints. Soft tissues are grossly normal. IMPRESSION: Enlarged cardiac silhouette. Calcific atherosclerotic disease of the aorta. Chronic interstitial lung changes, with bilateral pleural calcifications. Electronically Signed   By: Fidela Salisbury M.D.   On: 10/18/2016 13:21   Ct Head Wo Contrast  Result Date: 10/18/2016 CLINICAL DATA:  Recent fall EXAM: CT HEAD WITHOUT  CONTRAST TECHNIQUE: Contiguous axial images were obtained from the base of the skull through the vertex without intravenous contrast. COMPARISON:  12/11/2012 FINDINGS: Brain: Mild atrophic changes are noted. Chronic white matter ischemic change is seen. 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 No findings to suggest acute hemorrhage, acute infarction or space-occupying mass lesion are noted. Vascular: No hyperdense vessel or unexpected calcification. Skull: Normal. Negative for fracture or focal lesion. Sinuses/Orbits: No acute finding. Other: Right forehead hematoma is noted. IMPRESSION: Chronic changes without acute abnormality. Electronically Signed   By: Inez Catalina M.D.   On: 10/18/2016 12:10     Management plans discussed with the patient, family and they are in agreement.  CODE STATUS:     Code Status Orders        Start     Ordered   10/18/16 1511  Do not attempt resuscitation (DNR)  Continuous    Question Answer Comment  In the event of cardiac or respiratory ARREST Do not call a "code blue"   In the event of cardiac or respiratory ARREST Do not perform Intubation, CPR, defibrillation or ACLS   In the event of cardiac or respiratory ARREST Use medication by any route, position, wound care, and other measures to relive pain and suffering. May use oxygen, suction and manual treatment of airway obstruction as needed for comfort.      10/18/16 1511    Code Status History    Date Active Date Inactive Code Status Order ID Comments User Context   This patient has a current code status but no historical code status.      TOTAL TIME TAKING CARE OF THIS PATIENT: *40* minutes.    Arliss Hepburn M.D on 10/19/2016 at 1:25 PM  Between 7am to 6pm - Pager - [phone] After 6pm go to [email or web address] - password [email or web address] [facility]  Office  [phone]  CC: Primary care physician; Ezequiel Kayser, MD

## 2016-10-19 NOTE — Evaluation (Signed)
Physical Therapy Evaluation Patient Details Name: Kathryn [name] MRN: [id number] DOB: 25-May-1918 Today's Date: 10/19/2016   History of Present Illness  presented to ER secondary to mechanical fall in home environment; admitted with acute hypoxic respiratory failure secondary to pulmonary fibrosis.  Clinical Impression  Upon evaluation, patient alert and oriented; follows all commands and demonstrates good insight/safety awareness.  Bilat UE/LE strength and ROM grossly symmetrical and WFL for basic transfers and mobiltiy.  Able to complete bed mobility with mod indep; sit/stand, basic transfers and gait (50') with RW, cga.  Slow and guarded gait performance, min cuing for walker placement, but no overt buckling or LOB. Does maintains sats >91% on RA at rest and with exertion; left on RA end of session. RN informed/aware. Would benefit from skilled PT to address above deficits and promote optimal return to PLOF; Recommend transition to [place] upon discharge from acute hospitalization.     Follow Up Recommendations Home health PT    Equipment Recommendations   (has 2WRW and 4WRW in home environment)    Recommendations for Other Services       Precautions / Restrictions Precautions Precautions: Fall Restrictions Weight Bearing Restrictions: No      Mobility  Bed Mobility Overal bed mobility: Modified Independent                Transfers Overall transfer level: Needs assistance Equipment used: Rolling walker (2 wheeled) Transfers: Sit to/from Stand Sit to Stand: Min guard         General transfer comment: posterior surface of bilat LEs against edge of bed to assist with lift off and stabilization; requires UE support to complete transfer  Ambulation/Gait Ambulation/Gait assistance: Min assist Ambulation Distance (Feet): 50 Feet Assistive device: Rolling walker (2 wheeled)       General Gait Details: broad BOS, somewhat choppy steps with forward flexed posture, RW arms  length anterior to patient.  Slow and guarded, but no overt buckling or LOB  Stairs            Wheelchair Mobility    Modified Rankin (Stroke Patients Only)       Balance Overall balance assessment: Needs assistance Sitting-balance support: No upper extremity supported;Feet supported Sitting balance-Leahy Scale: Good     Standing balance support: Bilateral upper extremity supported Standing balance-Leahy Scale: Fair                               Pertinent Vitals/Pain Pain Assessment: No/denies pain    Home Living Family/patient expects to be discharged to:: Assisted living               Home Equipment: Walker - 2 wheels;Walker - 4 wheels      Prior Function Level of Independence: Independent with assistive device(s)         Comments: Mod indep with 4WRW within facility, RW in community; completes toileting and ADLs without staff assist.  Denies fall history.  No home O2.     Hand Dominance        Extremity/Trunk Assessment   Upper Extremity Assessment Upper Extremity Assessment: Overall WFL for tasks assessed    Lower Extremity Assessment Lower Extremity Assessment: Overall WFL for tasks assessed    Cervical / Trunk Assessment Cervical / Trunk Assessment:  (R scoliotic )  Communication   Communication: HOH  Cognition Arousal/Alertness: Awake/alert Behavior During Therapy: WFL for tasks assessed/performed Overall Cognitive Status: Within Functional Limits for tasks assessed  General Comments      Exercises Other Exercises Other Exercises: Rolling bilat, min assist, with use of bedrails; dep assist for management of incontinent bladder.  Encouraged OOB to BSC/toilet with RW and +1 from nursing for future toileting needs.  Patient voiced understanding.   Assessment/Plan    PT Assessment Patient needs continued PT services  PT Problem List Decreased range of  motion;Decreased activity tolerance;Decreased balance;Decreased mobility;Decreased coordination;Decreased knowledge of use of DME;Decreased safety awareness;Decreased knowledge of precautions       PT Treatment Interventions DME instruction;Gait training;Therapeutic activities;Therapeutic exercise;Balance training;Patient/family education;Functional mobility training    PT Goals (Current goals can be found in the Care Plan section)  Acute Rehab PT Goals Patient Stated Goal: to go back to [facility] PT Goal Formulation: With patient Time For Goal Achievement: 11/02/16 Potential to Achieve Goals: Good    Frequency Min 2X/week   Barriers to discharge Decreased caregiver support      Co-evaluation               AM-PAC PT "6 Clicks" Daily Activity  Outcome Measure Difficulty turning over in bed (including adjusting bedclothes, sheets and blankets)?: A Little Difficulty moving from lying on back to sitting on the side of the bed? : A Little Difficulty sitting down on and standing up from a chair with arms (e.g., wheelchair, bedside commode, etc,.)?: Total Help needed moving to and from a bed to chair (including a wheelchair)?: A Little Help needed walking in hospital room?: A Little Help needed climbing 3-5 steps with a railing? : A Lot 6 Click Score: 15    End of Session Equipment Utilized During Treatment: Gait belt Activity Tolerance: Patient tolerated treatment well Patient left: in chair;with call bell/phone within reach;with chair alarm set Nurse Communication: Mobility status PT Visit Diagnosis: Muscle weakness (generalized) (M62.81);History of falling (Z91.81)    Time: 6440-3474 PT Time Calculation (min) (ACUTE ONLY): 20 min   Charges:   PT Evaluation $PT Eval Low Complexity: 1 Procedure PT Treatments $Therapeutic Activity: 8-22 mins   PT G Codes:   PT G-Codes **NOT FOR INPATIENT CLASS** Functional Assessment Tool Used: AM-PAC 6 Clicks Basic  Mobility Functional Limitation: Mobility: Walking and moving around Mobility: Walking and Moving Around Current Status (Q5956): At least 40 percent but less than 60 percent impaired, limited or restricted Mobility: Walking and Moving Around Goal Status [phone]): At least 1 percent but less than 20 percent impaired, limited or restricted    Lahoma Constantin H. Owens Shark, PT, DPT, NCS 10/19/16, 10:38 AM [phone]

## 2016-10-19 NOTE — Progress Notes (Signed)
Pt is alert and oriented. No complaints of pain during the night. Pt remaining on 2L of oxygen. External catheter in place. No complaints of Shortness of breath this shift. Pt was able to sleep in between care.

## 2016-10-19 NOTE — Discharge Instructions (Signed)

## 2016-10-19 NOTE — Care Management Note (Signed)
Case Management Note  Patient Details  Name: Kathryn [name] MRN: [id number] Date of Birth: 1918/11/25  Subjective/Objective:   Observation patient. Met with patient at bedside. She sustained a fall at home. Also found to be hypoxic. Has Pulmonary Fibrosis.  Lives at [facility] alone. Uses a walker. Able to provide her own adls. Daughter assists her with transportation. No home O2. No home health. PCP Dr. Raechel Ache.                 Action/Plan: Will follow progression for discharge needs.   Expected Discharge Date:  10/20/16               Expected Discharge Plan:     In-House Referral:     Discharge planning Services  CM Consult  Post Acute Care Choice:    Choice offered to:  Patient  DME Arranged:    DME Agency:     HH Arranged:    [place] Agency:     Status of Service:  In process, will continue to follow  If discussed at Long Length of Stay Meetings, dates discussed:    Additional Comments:  Jolly Mango, RN 10/19/2016, 9:18 AM

## 2016-10-19 NOTE — Progress Notes (Signed)
Discharge information and medication details reviewed with patient and son. All questions answered. Printed AVS given to patient. IV removed. Vital signs stable. Patient was escorted out via wheelchair and helped into the car.  Wynema Birch, RN

## 2016-10-19 NOTE — Care Management Note (Signed)
Case Management Note  Patient Details  Name: Kathryn [name] MRN: [id number] Date of Birth: 04-Jun-1918  Subjective/Objective:  Discharging today                  Action/Plan: TC to Ms chase, patients daughter. She would like for patient to have Home health PT through [facility].. TC to [facility], spoke with Abby in therapy. She stated to have patient bring order back and they would get her started with PT.Son at bedside. Orders given to him and he is agreeable to POC.   Expected Discharge Date:  10/19/16               Expected Discharge Plan:  [place]  In-House Referral:     Discharge planning Services  CM Consult  Post Acute Care Choice:  Home Health Choice offered to:  Patient, Adult Children  DME Arranged:    DME Agency:     HH Arranged:  PT HH Agency:   ([facility])  Status of Service:  In process, will continue to follow  If discussed at Long Length of Stay Meetings, dates discussed:    Additional Comments:  Jolly Mango, RN 10/19/2016, 2:23 PM

## 2017-11-29 ENCOUNTER — Ambulatory Visit: Payer: Self-pay | Admitting: Urology

## 2017-12-07 ENCOUNTER — Emergency Department: Payer: Medicare Other

## 2017-12-07 ENCOUNTER — Other Ambulatory Visit: Payer: Self-pay

## 2017-12-07 ENCOUNTER — Inpatient Hospital Stay: Payer: Medicare Other

## 2017-12-07 ENCOUNTER — Inpatient Hospital Stay
Admission: EM | Admit: 2017-12-07 | Discharge: 2017-12-13 | DRG: 180 | Disposition: A | Discharge status: Skilled Nursing Facility | Payer: Medicare Other | Attending: Internal Medicine | Admitting: Internal Medicine

## 2017-12-07 DIAGNOSIS — R791 Abnormal coagulation profile: Secondary | ICD-10-CM | POA: Diagnosis present

## 2017-12-07 DIAGNOSIS — J90 Pleural effusion, not elsewhere classified: Secondary | ICD-10-CM

## 2017-12-07 DIAGNOSIS — N390 Urinary tract infection, site not specified: Secondary | ICD-10-CM | POA: Diagnosis present

## 2017-12-07 DIAGNOSIS — E785 Hyperlipidemia, unspecified: Secondary | ICD-10-CM | POA: Diagnosis present

## 2017-12-07 DIAGNOSIS — Z515 Encounter for palliative care: Secondary | ICD-10-CM | POA: Diagnosis present

## 2017-12-07 DIAGNOSIS — I480 Paroxysmal atrial fibrillation: Secondary | ICD-10-CM | POA: Diagnosis present

## 2017-12-07 DIAGNOSIS — I10 Essential (primary) hypertension: Secondary | ICD-10-CM | POA: Diagnosis present

## 2017-12-07 DIAGNOSIS — Z66 Do not resuscitate: Secondary | ICD-10-CM | POA: Diagnosis present

## 2017-12-07 DIAGNOSIS — Z7901 Long term (current) use of anticoagulants: Secondary | ICD-10-CM

## 2017-12-07 DIAGNOSIS — J910 Malignant pleural effusion: Secondary | ICD-10-CM | POA: Diagnosis not present

## 2017-12-07 DIAGNOSIS — Z9012 Acquired absence of left breast and nipple: Secondary | ICD-10-CM | POA: Diagnosis not present

## 2017-12-07 DIAGNOSIS — J9601 Acute respiratory failure with hypoxia: Secondary | ICD-10-CM

## 2017-12-07 DIAGNOSIS — Z7983 Long term (current) use of bisphosphonates: Secondary | ICD-10-CM | POA: Diagnosis not present

## 2017-12-07 DIAGNOSIS — R0902 Hypoxemia: Secondary | ICD-10-CM

## 2017-12-07 DIAGNOSIS — H409 Unspecified glaucoma: Secondary | ICD-10-CM | POA: Diagnosis present

## 2017-12-07 DIAGNOSIS — Z90710 Acquired absence of both cervix and uterus: Secondary | ICD-10-CM

## 2017-12-07 DIAGNOSIS — Z7189 Other specified counseling: Secondary | ICD-10-CM

## 2017-12-07 DIAGNOSIS — C3490 Malignant neoplasm of unspecified part of unspecified bronchus or lung: Secondary | ICD-10-CM | POA: Diagnosis present

## 2017-12-07 DIAGNOSIS — Z803 Family history of malignant neoplasm of breast: Secondary | ICD-10-CM | POA: Diagnosis not present

## 2017-12-07 DIAGNOSIS — R296 Repeated falls: Secondary | ICD-10-CM | POA: Diagnosis present

## 2017-12-07 DIAGNOSIS — J9600 Acute respiratory failure, unspecified whether with hypoxia or hypercapnia: Secondary | ICD-10-CM | POA: Diagnosis present

## 2017-12-07 DIAGNOSIS — I482 Chronic atrial fibrillation: Secondary | ICD-10-CM | POA: Diagnosis not present

## 2017-12-07 DIAGNOSIS — Z853 Personal history of malignant neoplasm of breast: Secondary | ICD-10-CM

## 2017-12-07 DIAGNOSIS — J942 Hemothorax: Secondary | ICD-10-CM | POA: Diagnosis not present

## 2017-12-07 DIAGNOSIS — R54 Age-related physical debility: Secondary | ICD-10-CM | POA: Diagnosis not present

## 2017-12-07 DIAGNOSIS — R0602 Shortness of breath: Secondary | ICD-10-CM | POA: Diagnosis present

## 2017-12-07 DIAGNOSIS — J9690 Respiratory failure, unspecified, unspecified whether with hypoxia or hypercapnia: Secondary | ICD-10-CM

## 2017-12-07 DIAGNOSIS — Z79899 Other long term (current) drug therapy: Secondary | ICD-10-CM | POA: Diagnosis not present

## 2017-12-07 DIAGNOSIS — C801 Malignant (primary) neoplasm, unspecified: Secondary | ICD-10-CM | POA: Diagnosis present

## 2017-12-07 DIAGNOSIS — Z9889 Other specified postprocedural states: Secondary | ICD-10-CM

## 2017-12-07 HISTORY — DX: Unspecified glaucoma: H40.9

## 2017-12-07 LAB — URINALYSIS, COMPLETE (UACMP) WITH MICROSCOPIC
BILIRUBIN URINE: NEGATIVE
Glucose, UA: NEGATIVE mg/dL
KETONES UR: NEGATIVE mg/dL
NITRITE: POSITIVE — AB
PROTEIN: NEGATIVE mg/dL
SPECIFIC GRAVITY, URINE: 1.01 (ref 1.005–1.030)
pH: 5 (ref 5.0–8.0)

## 2017-12-07 LAB — BODY FLUID CELL COUNT WITH DIFFERENTIAL
Eos, Fluid: 0 %
LYMPHS FL: 4 %
Monocyte-Macrophage-Serous Fluid: 15 %
NEUTROPHIL FLUID: 81 %
WBC FLUID: 9657 uL

## 2017-12-07 LAB — CBC WITH DIFFERENTIAL/PLATELET
BASOS ABS: 0 10*3/uL (ref 0–0.1)
BASOS PCT: 1 %
Eosinophils Absolute: 0.1 10*3/uL (ref 0–0.7)
Eosinophils Relative: 1 %
HEMATOCRIT: 43.1 % (ref 35.0–47.0)
Hemoglobin: 14.7 g/dL (ref 12.0–16.0)
LYMPHS PCT: 13 %
Lymphs Abs: 1.3 10*3/uL (ref 1.0–3.6)
MCH: 33.6 pg (ref 26.0–34.0)
MCHC: 34 g/dL (ref 32.0–36.0)
MCV: 98.5 fL (ref 80.0–100.0)
Monocytes Absolute: 1.1 10*3/uL — ABNORMAL HIGH (ref 0.2–0.9)
Monocytes Relative: 10 %
NEUTROS ABS: 7.8 10*3/uL — AB (ref 1.4–6.5)
Neutrophils Relative %: 75 %
PLATELETS: 210 10*3/uL (ref 150–440)
RBC: 4.38 MIL/uL (ref 3.80–5.20)
RDW: 13.7 % (ref 11.5–14.5)
WBC: 10.4 10*3/uL (ref 3.6–11.0)

## 2017-12-07 LAB — GLUCOSE, CAPILLARY: Glucose-Capillary: 102 mg/dL — ABNORMAL HIGH (ref 70–99)

## 2017-12-07 LAB — COMPREHENSIVE METABOLIC PANEL
ALBUMIN: 3.8 g/dL (ref 3.5–5.0)
ALT: 30 U/L (ref 0–44)
ANION GAP: 7 (ref 5–15)
AST: 36 U/L (ref 15–41)
Alkaline Phosphatase: 23 U/L — ABNORMAL LOW (ref 38–126)
BILIRUBIN TOTAL: 0.9 mg/dL (ref 0.3–1.2)
BUN: 15 mg/dL (ref 8–23)
CHLORIDE: 100 mmol/L (ref 98–111)
CO2: 30 mmol/L (ref 22–32)
Calcium: 9.4 mg/dL (ref 8.9–10.3)
Creatinine, Ser: 0.79 mg/dL (ref 0.44–1.00)
GFR calc Af Amer: >60 mL/min (ref >60)
Glucose, Bld: 125 mg/dL — ABNORMAL HIGH (ref 70–99)
POTASSIUM: 4.1 mmol/L (ref 3.5–5.1)
Sodium: 137 mmol/L (ref 135–145)
TOTAL PROTEIN: 7.4 g/dL (ref 6.5–8.1)

## 2017-12-07 LAB — LACTATE DEHYDROGENASE: LDH: 193 U/L — ABNORMAL HIGH (ref 98–192)

## 2017-12-07 LAB — DIGOXIN LEVEL

## 2017-12-07 LAB — PROTEIN, TOTAL: Total Protein: 6.8 g/dL (ref 6.5–8.1)

## 2017-12-07 LAB — PROTIME-INR
INR: 2.75
PROTHROMBIN TIME: 28.9 s — AB (ref 11.4–15.2)

## 2017-12-07 LAB — BRAIN NATRIURETIC PEPTIDE: B Natriuretic Peptide: 92 pg/mL (ref 0.0–100.0)

## 2017-12-07 LAB — TROPONIN I: Troponin I: <0.03 ng/mL (ref <0.03)

## 2017-12-07 LAB — MRSA PCR SCREENING: MRSA by PCR: NEGATIVE

## 2017-12-07 MED ORDER — HYDROCHLOROTHIAZIDE 12.5 MG PO CAPS
12.5000 mg | ORAL_CAPSULE | Freq: Every day | ORAL | Status: DC
  Administered 2017-12-09 – 2017-12-13 (×5): 12.5 mg via ORAL
  Filled 2017-12-07 (×7): qty 1

## 2017-12-07 MED ORDER — ACETAMINOPHEN 325 MG PO TABS: 650.0000 mg | ORAL_TABLET | Freq: Four times a day (QID) | ORAL | Status: DC | PRN

## 2017-12-07 MED ORDER — LATANOPROST 0.005 % OP SOLN
1.0000 [drp] | Freq: Every day | OPHTHALMIC | Status: DC
  Administered 2017-12-07 – 2017-12-12 (×6): 1 [drp] via OPHTHALMIC
  Filled 2017-12-07: qty 2.5

## 2017-12-07 MED ORDER — LISINOPRIL 20 MG PO TABS
20.0000 mg | ORAL_TABLET | Freq: Every day | ORAL | Status: DC
  Administered 2017-12-09 – 2017-12-13 (×5): 20 mg via ORAL
  Filled 2017-12-07 (×6): qty 1

## 2017-12-07 MED ORDER — SODIUM CHLORIDE 0.9% FLUSH
3.0000 mL | Freq: Two times a day (BID) | INTRAVENOUS | Status: DC
  Administered 2017-12-07 – 2017-12-13 (×12): 3 mL via INTRAVENOUS

## 2017-12-07 MED ORDER — WARFARIN SODIUM 2.5 MG PO TABS
2.5000 mg | ORAL_TABLET | ORAL | Status: DC
  Filled 2017-12-07: qty 1

## 2017-12-07 MED ORDER — DIGOXIN 125 MCG PO TABS
0.1250 mg | ORAL_TABLET | Freq: Every day | ORAL | Status: DC
  Administered 2017-12-07: 0.125 mg via ORAL
  Filled 2017-12-07 (×3): qty 1

## 2017-12-07 MED ORDER — ACETAMINOPHEN 650 MG RE SUPP: 650.0000 mg | Freq: Four times a day (QID) | RECTAL | Status: DC | PRN

## 2017-12-07 MED ORDER — ONDANSETRON HCL 4 MG/2ML IJ SOLN: 4.0000 mg | Freq: Four times a day (QID) | INTRAMUSCULAR | Status: DC | PRN

## 2017-12-07 MED ORDER — SODIUM CHLORIDE 0.9% FLUSH: 3.0000 mL | INTRAVENOUS | Status: DC | PRN

## 2017-12-07 MED ORDER — HYDRALAZINE HCL 20 MG/ML IJ SOLN: 10.0000 mg | Freq: Four times a day (QID) | INTRAMUSCULAR | Status: DC | PRN

## 2017-12-07 MED ORDER — WARFARIN SODIUM 5 MG PO TABS: 5.0000 mg | ORAL_TABLET | ORAL | Status: DC

## 2017-12-07 MED ORDER — WARFARIN SODIUM 2.5 MG PO TABS: 2.5000 mg | ORAL_TABLET | ORAL | Status: DC

## 2017-12-07 MED ORDER — TRAMADOL HCL 50 MG PO TABS: 50.0000 mg | ORAL_TABLET | Freq: Four times a day (QID) | ORAL | Status: DC | PRN

## 2017-12-07 MED ORDER — ONDANSETRON HCL 4 MG PO TABS: 4.0000 mg | ORAL_TABLET | Freq: Four times a day (QID) | ORAL | Status: DC | PRN

## 2017-12-07 MED ORDER — DILTIAZEM HCL 100 MG IV SOLR
5.0000 mg/h | INTRAVENOUS | Status: DC
  Administered 2017-12-07 – 2017-12-08 (×2): 5 mg/h via INTRAVENOUS
  Filled 2017-12-07 (×2): qty 100

## 2017-12-07 MED ORDER — SIMVASTATIN 20 MG PO TABS
10.0000 mg | ORAL_TABLET | Freq: Every day | ORAL | Status: DC
  Administered 2017-12-07: 10 mg via ORAL
  Filled 2017-12-07: qty 1

## 2017-12-07 MED ORDER — TIMOLOL MALEATE 0.5 % OP SOLN
1.0000 [drp] | Freq: Every day | OPHTHALMIC | Status: DC
  Administered 2017-12-07 – 2017-12-13 (×7): 1 [drp] via OPHTHALMIC
  Filled 2017-12-07 (×2): qty 5

## 2017-12-07 MED ORDER — LIDOCAINE HCL 1 % IJ SOLN
10.0000 mL | Freq: Once | INTRAMUSCULAR | Status: AC
  Administered 2017-12-07: 10 mL via INTRADERMAL
  Filled 2017-12-07: qty 10

## 2017-12-07 MED ORDER — ADULT MULTIVITAMIN W/MINERALS CH
1.0000 | ORAL_TABLET | Freq: Every day | ORAL | Status: DC
  Administered 2017-12-08 – 2017-12-13 (×6): 1 via ORAL
  Filled 2017-12-07 (×5): qty 1

## 2017-12-07 MED ORDER — FERROUS SULFATE 325 (65 FE) MG PO TABS
325.0000 mg | ORAL_TABLET | Freq: Two times a day (BID) | ORAL | Status: DC
  Administered 2017-12-08 – 2017-12-10 (×2): 325 mg via ORAL
  Filled 2017-12-07 (×7): qty 1

## 2017-12-07 MED ORDER — CALCIUM CARBONATE 1500 (600 CA) MG PO TABS
1.0000 | ORAL_TABLET | Freq: Every day | ORAL | Status: DC
  Filled 2017-12-07: qty 1

## 2017-12-07 MED ORDER — POTASSIUM CHLORIDE CRYS ER 20 MEQ PO TBCR
20.0000 meq | EXTENDED_RELEASE_TABLET | Freq: Every day | ORAL | Status: DC
  Administered 2017-12-08 – 2017-12-13 (×6): 20 meq via ORAL
  Filled 2017-12-07 (×6): qty 1

## 2017-12-07 MED ORDER — LISINOPRIL-HYDROCHLOROTHIAZIDE 20-12.5 MG PO TABS
1.0000 | ORAL_TABLET | Freq: Every day | ORAL | Status: DC
Start: 2017-12-07 — End: 2017-12-07

## 2017-12-07 MED ORDER — WARFARIN - PHYSICIAN DOSING INPATIENT: Freq: Every day | Status: DC

## 2017-12-07 MED ORDER — SODIUM CHLORIDE 0.9 % IV SOLN: 250.0000 mL | INTRAVENOUS | Status: DC | PRN

## 2017-12-07 NOTE — ED Notes (Signed)
Dr. McShane at bedside.  

## 2017-12-07 NOTE — ED Notes (Signed)
Pt placed on bipap at this time. Family member at bedside.

## 2017-12-07 NOTE — Progress Notes (Signed)
Pt taken off bipap, placed on 4lpm [place], tolerating well at this time. sats 94%, respiratory rate 20/min, will continue to monitor.

## 2017-12-07 NOTE — ED Triage Notes (Signed)
Pt arrives to ED from [facility] via [facility] for A fib RVR and SOB. Pt states she felt palpitations this AM and notices she feels SOB with palpitations. Pt is in a fib upon arrival from 100-130's. SOB, quick shallow breathing. Doesn't wear oxygen, was upper 80's with EMS so placed on nasal cannula. On room air pt was 84%. Placed on 2, 4, then 6 L and sating 93% currently. Has been instructed to take slow deep breaths but is unable to at this time. Grunting breathing noted. RT called. Denies  CP.

## 2017-12-07 NOTE — H&P (Signed)
[facility] [place] NAME: Kathryn [name]    MR#:  [id number]  DATE OF BIRTH:  January 11, 1919  DATE OF ADMISSION:  12/07/2017  PRIMARY CARE PHYSICIAN: Ezequiel Kayser, MD   REQUESTING/REFERRING PHYSICIAN: Schuyler Amor, MD  CHIEF COMPLAINT:   Chief Complaint  Patient presents with  . Atrial Fibrillation  . Shortness of Breath    HISTORY OF PRESENT ILLNESS: Kathryn [name]  is a 84 y.o. female with a known history of atrial fibrillation, history of breast cancer, glaucoma, hyperlipidemia and hypertension who is on chronic Coumadin therapy presenting with acute onset of shortness of breath and heart beating fast.  Patient in the emergency room was noted to be in A. fib with RVR.  Also had a chest x-ray which showed large left-sided pleural effusion.  Patient had to be placed on BiPAP.  She denies any chest pains no fevers no recent weight gain or weight loss.  Does have some swelling of the lower extremity.     PAST MEDICAL HISTORY:   Past Medical History:  Diagnosis Date  . A-fib ([place])   . Breast cancer ([place]) 04/20/2001   lt mastecomy  . Glaucoma   . Hyperlipidemia   . Hypertension     PAST SURGICAL HISTORY:  Past Surgical History:  Procedure Laterality Date  . ABDOMINAL HYSTERECTOMY    . MASTECTOMY Left 04/20/01    SOCIAL HISTORY:  Social History   Tobacco Use  . Smoking status: Never Smoker  . Smokeless tobacco: Never Used  Substance Use Topics  . Alcohol use: No    FAMILY HISTORY:  Family History  Problem Relation Age of Onset  . Breast cancer Sister   . Throat cancer Father     DRUG ALLERGIES: No Known Allergies  REVIEW OF SYSTEMS:   CONSTITUTIONAL: No fever, positive fatigue or positive weakness.  EYES: No blurred or double vision.  EARS, NOSE, AND THROAT: No tinnitus or ear pain.  RESPIRATORY: No cough, positive shortness of breath, wheezing or hemoptysis.  CARDIOVASCULAR: No chest pain, orthopnea, edema.   GASTROINTESTINAL: No nausea, vomiting, diarrhea or abdominal pain.  GENITOURINARY: No dysuria, hematuria.  ENDOCRINE: No polyuria, nocturia,  HEMATOLOGY: No anemia, easy bruising or bleeding SKIN: No rash or lesion. MUSCULOSKELETAL: No joint pain or arthritis.   NEUROLOGIC: No tingling, numbness, weakness.  PSYCHIATRY: No anxiety or depression.   MEDICATIONS AT HOME:  Prior to Admission medications   Medication Sig Start Date End Date Taking? Authorizing Provider  calcium carbonate (CALCIUM 600) 1500 (600 Ca) MG TABS tablet Take 1 tablet by mouth daily.   Yes [provider]  digoxin (LANOXIN) 0.125 MG tablet Take 0.125 mg by mouth daily. 04/23/16  Yes [provider]  diltiazem (CARDIZEM CD) 300 MG 24 hr capsule Take 300 mg by mouth daily. 10/27/15  Yes [provider]  ferrous sulfate 325 (65 FE) MG tablet Take 325 mg by mouth 2 (two) times daily.   Yes [provider]  latanoprost (XALATAN) 0.005 % ophthalmic solution Place 1 drop into both eyes at bedtime. 09/20/13  Yes [provider]  lisinopril-hydrochlorothiazide (PRINZIDE,ZESTORETIC) 20-12.5 MG tablet Take 1 tablet by mouth daily. 04/23/16  Yes [provider]  Multiple Vitamin (MULTI-VITAMINS) TABS Take 1 tablet by mouth daily.   Yes [provider]  Multiple Vitamins-Minerals (PRESERVISION AREDS PO) Take 1 capsule by mouth 2 (two) times daily.   Yes [provider]  potassium chloride SA (K-DUR,KLOR-CON) 20 MEQ tablet  Take 20 mEq by mouth daily. 04/23/16  Yes [provider]  simvastatin (ZOCOR) 10 MG tablet Take 10 mg by mouth at bedtime. 04/23/16  Yes [provider]  timolol (TIMOPTIC) 0.5 % ophthalmic solution Place 1 drop into both eyes daily.  09/20/13  Yes [provider]  warfarin (COUMADIN) 2.5 MG tablet Take 2.5-5 mg by mouth See admin instructions. Take 2.5 mg by mouth on Tuesday, Thursday, Saturday and Sunday. Take 5 mg by mouth on  Monday, Wednesday and Friday. 04/23/16  Yes [provider]  alendronate (FOSAMAX) 70 MG tablet Take 70 mg by mouth once a week. Take with a full glass of water on an empty stomach.    [provider]      PHYSICAL EXAMINATION:   VITAL SIGNS: Blood pressure (!) 190/103, pulse (!) 56, temperature 97.9 F (36.6 C), temperature source Oral, resp. rate 19, height 5\' 2"  (1.575 m), weight 65.5 kg, SpO2 95 %.  GENERAL:  84 y.o.-year-old patient lying in the bed with acute respiratory failure EYES: Pupils equal, round, reactive to light and accommodation. No scleral icterus. Extraocular muscles intact.  HEENT: Head atraumatic, normocephalic. Oropharynx and nasopharynx clear.  NECK:  Supple, no jugular venous distention. No thyroid enlargement, no tenderness.  LUNGS: Crease breath sounds at the left lung CARDIOVASCULAR: Irregularly irregular. No murmurs, rubs, or gallops.  ABDOMEN: Soft, nontender, nondistended. Bowel sounds present. No organomegaly or mass.  EXTREMITIES: Positive pedal edema, cyanosis, or clubbing.  NEUROLOGIC: Cranial nerves II through XII are intact. Muscle strength 5/5 in all extremities. Sensation intact. Gait not checked.  PSYCHIATRIC: The patient is alert and oriented x 3.  SKIN: No obvious rash, lesion, or ulcer.   LABORATORY PANEL:   CBC Recent Labs  Lab 12/07/17 1036  WBC 10.4  HGB 14.7  HCT 43.1  PLT 210  MCV 98.5  MCH 33.6  MCHC 34.0  RDW 13.7  LYMPHSABS 1.3  MONOABS 1.1*  EOSABS 0.1  BASOSABS 0.0   ------------------------------------------------------------------------------------------------------------------  Chemistries  Recent Labs  Lab 12/07/17 1036  NA 137  K 4.1  CL 100  CO2 30  GLUCOSE 125*  BUN 15  CREATININE 0.79  CALCIUM 9.4  AST 36  ALT 30  ALKPHOS 23*  BILITOT 0.9   ------------------------------------------------------------------------------------------------------------------ estimated creatinine  clearance is 36.6 mL/min (by C-G formula based on SCr of 0.79 mg/dL). ------------------------------------------------------------------------------------------------------------------ No results for input(s): TSH, T4TOTAL, T3FREE, THYROIDAB in the last 72 hours.  Invalid input(s): FREET3   Coagulation profile Recent Labs  Lab 12/07/17 1036  INR 2.75   ------------------------------------------------------------------------------------------------------------------- No results for input(s): DDIMER in the last 72 hours. -------------------------------------------------------------------------------------------------------------------  Cardiac Enzymes Recent Labs  Lab 12/07/17 1036  TROPONINI <0.03   ------------------------------------------------------------------------------------------------------------------ Invalid input(s): POCBNP  ---------------------------------------------------------------------------------------------------------------  Urinalysis    Component Value Date/Time   COLORURINE YELLOW (A) 10/18/2016 1319   APPEARANCEUR CLOUDY (A) 10/18/2016 1319   APPEARANCEUR Hazy 12/11/2012 2047   LABSPEC 1.010 10/18/2016 1319   LABSPEC 1.005 12/11/2012 2047   PHURINE 7.0 10/18/2016 1319   GLUCOSEU NEGATIVE 10/18/2016 1319   GLUCOSEU Negative 12/11/2012 2047   HGBUR SMALL (A) 10/18/2016 1319   BILIRUBINUR NEGATIVE 10/18/2016 1319   BILIRUBINUR Negative 12/11/2012 2047   KETONESUR NEGATIVE 10/18/2016 1319   PROTEINUR NEGATIVE 10/18/2016 1319   NITRITE NEGATIVE 10/18/2016 1319   LEUKOCYTESUR MODERATE (A) 10/18/2016 1319   LEUKOCYTESUR 1+ 12/11/2012 2047     RADIOLOGY: Dg Chest Port 1 View  Result Date: 12/07/2017 CLINICAL DATA:  Dyspnea EXAM: PORTABLE  CHEST 1 VIEW COMPARISON:  10/18/2016 chest radiograph. FINDINGS: Stable cardiomediastinal silhouette with marked cardiomegaly. No pneumothorax. No right pleural effusion. Large left pleural effusion with right  mediastinal shift. No pulmonary edema. Near complete left lung atelectasis with minimal residual aeration in the apical left upper lobe. Clear right lung. IMPRESSION: Large left pleural effusion with right mediastinal shift. Near complete left lung atelectasis with minimal residual aeration in the upper left lung. Marked cardiomegaly. Electronically Signed   By: Ilona Sorrel M.D.   On: 12/07/2017 11:03    EKG: Orders placed or performed during the hospital encounter of 12/07/17  . EKG 12-Lead  . EKG 12-Lead  . ED EKG  . ED EKG    IMPRESSION AND PLAN: Patient is a 84 year old white female with history of atrial fibrillation presenting with acute respiratory failure   1.  Acute respiratory failure requiring BiPAP this is related to large left-sided pleural effusion I discussed with Dr. 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  A. fib with RVR use IV Cardizem as needed we will ask her cardiologist to see stop Coumadin  3.  Hyperlipidemia continue Zocor  4.  Essential hypertension use IV hydralazine as needed  5.  CODE STATUS confirmed DNR    All the records are reviewed and case discussed with ED provider. Management plans discussed with the patient, family and they are in agreement.  CODE STATUS: Code Status History    Date Active Date Inactive Code Status Order ID Comments User Context   10/18/2016 1511 10/19/2016 1931 DNR [id number]  Hillary Bow, MD ED    Questions for Most Recent Historical Code Status (Order [id number])    Question Answer Comment   In the event of cardiac or respiratory ARREST Do not call a "code blue"    In the event of cardiac or respiratory ARREST Do not perform Intubation, CPR, defibrillation or ACLS    In the event of cardiac or respiratory ARREST Use medication by any route, position, wound care,  and other measures to relive pain and suffering. May use oxygen, suction and manual treatment of airway obstruction as needed for comfort.        TOTAL TIME TAKING CARE OF THIS PATIENT: 55 minutes critical care time spent  Dustin Flock M.D on 12/07/2017 at 11:35 AM  Between 7am to 6pm - Pager - [phone]  After 6pm go to [email or web address] - password [email or web address]  [facility] Office  [phone]  CC: Primary care physician; Ezequiel Kayser, MD

## 2017-12-07 NOTE — ED Notes (Signed)
X-ray at bedside

## 2017-12-07 NOTE — Procedures (Signed)
LEFT THORACENTESIS  INDICATION: VERY LARGE l PLEURAL EFFUSION  CONSENT: Procedure including risks and alternatives discussed with patient  PROCEDURE: Area defined by exam and ultrasound identifying a large pocket of pleural fluid. Skin prep with chlorhexidine.Local anesthesia with 1% lidocaine. Drape placed. Very small incision made with scalpel. Thoracentesis needle advanced into the pleural space until pleural fluid aspirated. Catheter advanced over needle and needle removed. Suction tubing attached to vacutainer bottle. 1 liter bloody fluid (port wine appearing) removed. Pt tolerated procedure well. Follow up CXR pending  Merton Border, MD PCCM service Mobile [phone] Pager [phone] 12/07/2017 2:20 PM

## 2017-12-07 NOTE — Consult Note (Signed)
PULMONARY/CCM CONSULT NOTE  Requesting MD/Service:  Date of initial consultation:  Reason for consultation:   PT PROFILE: 84 y.o. female never smoker admitted to ICU/SDU with acute respiratory failure and very large left pleural effusion  HPI:  As above.  At the time of my initial evaluation, history was limited due to the fact that the patient was on BiPAP.  Notes from the emergency department indicate that patient was brought from SNF after she was found to be hypoxic.  In the emergency department she was placed on BiPAP with improvement in respiratory distress.  She is DNR/DNI.  Because of the BiPAP, she was brought to ICU/SDU.  Past Medical History:  Diagnosis Date  . A-fib ([place])   . Breast cancer ([place]) 04/20/2001   lt mastecomy  . Glaucoma   . Hyperlipidemia   . Hypertension     Past Surgical History:  Procedure Laterality Date  . ABDOMINAL HYSTERECTOMY    . MASTECTOMY Left 04/20/01    MEDICATIONS: I have reviewed all medications and confirmed regimen as documented  Social History   Socioeconomic History  . Marital status: Widowed    Spouse name: Not on file  . Number of children: Not on file  . Years of education: Not on file  . Highest education level: Not on file  Occupational History  . Not on file  Social Needs  . Financial resource strain: Not on file  . Food insecurity:    Worry: Not on file    Inability: Not on file  . Transportation needs:    Medical: Not on file    Non-medical: Not on file  Tobacco Use  . Smoking status: Never Smoker  . Smokeless tobacco: Never Used  Substance and Sexual Activity  . Alcohol use: No  . Drug use: No  . Sexual activity: Not on file  Lifestyle  . Physical activity:    Days per week: Not on file    Minutes per session: Not on file  . Stress: Not on file  Relationships  . Social connections:    Talks on phone: Not on file    Gets together: Not on file    Attends religious service: Not on file    Active member of  club or organization: Not on file    Attends meetings of clubs or organizations: Not on file    Relationship status: Not on file  . Intimate partner violence:    Fear of current or ex partner: Not on file    Emotionally abused: Not on file    Physically abused: Not on file    Forced sexual activity: Not on file  Other Topics Concern  . Not on file  Social History Narrative  . Not on file    Family History  Problem Relation Age of Onset  . Breast cancer Sister   . Throat cancer Father     ROS: No fever, myalgias/arthralgias, unexplained weight loss or weight gain No new focal weakness or sensory deficits No otalgia, hearing loss, visual changes, nasal and sinus symptoms, mouth and throat problems No neck pain or adenopathy No abdominal pain, N/V/D, diarrhea, change in bowel pattern No dysuria, change in urinary pattern   Vitals:   12/07/17 1200 12/07/17 1230 12/07/17 1302 12/07/17 1303  BP: (!) 187/120 (!) 168/125    Pulse: (!) 118 62    Resp: (!) 51 (!) 23 (!) 22 (!) 24  Temp:   (!) 97 F (36.1 C)   TempSrc:  Axillary   SpO2: 95% (!) 89% 92% 92%  Weight:   67.7 kg   Height:   5\' 2"  (1.575 m)      EXAM:  Gen: Alert, seemingly appropriate, on BiPAP HEENT: NCAT, sclera white, oropharynx normal Neck: Supple without LAN, thyromegaly, JVD Lungs: Bronchial breath sounds on left with dullness to percussion throughout on left, no wheezes Cardiovascular: IR IR, no murmurs noted Abdomen: Soft, nontender, normal BS Ext: Symmetric lower extremity edema Neuro: CNs grossly intact, motor and sensory intact Skin: Limited exam, no lesions noted  DATA:   BMP Latest Ref Rng & Units 12/07/2017 10/19/2016 10/18/2016  Glucose 70 - 99 mg/dL 125(H) 104(H) 112(H)  BUN 8 - 23 mg/dL 15 17 24(H)  Creatinine 0.44 - 1.00 mg/dL 0.79 0.62 0.75  Sodium 135 - 145 mmol/L 137 141 140  Potassium 3.5 - 5.1 mmol/L 4.1 3.0(L) 3.6  Chloride 98 - 111 mmol/L 100 99(L) 102  CO2 22 - 32 mmol/L 30 33(H)  30  Calcium 8.9 - 10.3 mg/dL 9.4 8.7(L) 9.3    CBC Latest Ref Rng & Units 12/07/2017 10/18/2016 12/11/2012  WBC 3.6 - 11.0 K/uL 10.4 9.1 9.2  Hemoglobin 12.0 - 16.0 g/dL 14.7 13.8 14.7  Hematocrit 35.0 - 47.0 % 43.1 41.1 43.5  Platelets 150 - 440 K/uL 210 169 198    CXR: Very large left pleural effusion with mediastinal shift from left to right.  Extensive biapical oral calcifications  I have personally reviewed all chest radiographs reported above including CXRs and CT chest unless otherwise indicated  IMPRESSION:   Advanced age Chronic atrial fibrillation on chronic anticoagulation (warfarin) Very large L pleural effusion  This is not due to heart failure and notably, her BNP is normal  PLAN:  I discussed goals of care with the patient's son and daughter.  We agreed to undertake procedures that present minimal risk and offer significant benefit.  Therefore, thoracentesis seemed reasonable.   I performed a thoracentesis at the bedside.  1 L of bloody pleural to it was removed.  The patient tolerated the procedure well and appeared to be improved from a respiratory point of view.  Pleural fluid studies have been sent.  Most important will be cytology.  No pleural fluid was bloody and therefore I have discontinued warfarin.  I do not consider that she is a good candidate for any further anticoagulation in the future  Change BiPAP to as needed only  Recheck CXR in the morning  If pleural fluid re-accumulates rapidly, we could consider Pleurx catheter    Merton Border, MD PCCM service Mobile [phone] Pager [phone] 12/07/2017 2:51 PM

## 2017-12-07 NOTE — ED Notes (Signed)
Pt stated that she urinates a lot. States she wears a pad and pad was saturated at this time. Pt tolerating lying down (but became tachypenic while lying) to have pants, long johns, stockings, underwear, and shoes removed. All placed in belongings bag with 2 shirts. Chuck and brief placed on pt as well as external catheter. Pt stated she had used this before. Sat pt back up to ease breathing. Pt is able to talk in sentences at this time, she had not previously been able to. Pt states she doesn't feel the palpitations any longer. Family remains at bedside.

## 2017-12-07 NOTE — ED Notes (Signed)
Admitting MD at bedside.

## 2017-12-07 NOTE — ED Provider Notes (Signed)
[facility] Emergency Department Provider Note  ____________________________________________   I have reviewed the triage vital signs and the nursing notes. Where available I have reviewed prior notes and, if possible and indicated, outside hospital notes.    HISTORY  Chief Complaint Atrial Fibrillation and Shortness of Breath    HPI Kathryn [name] is a 84 y.o. female who is DNR and I have verified this with the family.  Patient does have a history of paroxysmal atrial fib on Coumadin and on digitalis.  According to EMS and patient she became short of breath this morning.  Apparently was feeling okay yesterday.  Brought in with low sats but responded well to oxygen.  Patient is very hard of hearing.  She is at a facility.  Level 5 chart caveat; no further history available due to patient status.     Past Medical History:  Diagnosis Date  . A-fib ([place])   . Breast cancer ([place]) 04/20/2001   lt mastecomy  . Glaucoma   . Hyperlipidemia   . Hypertension     Patient Active Problem List   Diagnosis Date Noted  . Hypoxia 10/18/2016    Past Surgical History:  Procedure Laterality Date  . ABDOMINAL HYSTERECTOMY    . MASTECTOMY Left 04/20/01    Prior to Admission medications   Medication Sig Start Date End Date Taking? Authorizing Provider  calcium carbonate (CALCIUM 600) 1500 (600 Ca) MG TABS tablet Take 1 tablet by mouth daily.   Yes [provider]  digoxin (LANOXIN) 0.125 MG tablet Take 0.125 mg by mouth daily. 04/23/16  Yes [provider]  diltiazem (CARDIZEM CD) 300 MG 24 hr capsule Take 300 mg by mouth daily. 10/27/15  Yes [provider]  ferrous sulfate 325 (65 FE) MG tablet Take 325 mg by mouth 2 (two) times daily.   Yes [provider]  latanoprost (XALATAN) 0.005 % ophthalmic solution Place 1 drop into both eyes at bedtime. 09/20/13  Yes [provider]  lisinopril-hydrochlorothiazide (PRINZIDE,ZESTORETIC)  20-12.5 MG tablet Take 1 tablet by mouth daily. 04/23/16  Yes [provider]  Multiple Vitamin (MULTI-VITAMINS) TABS Take 1 tablet by mouth daily.   Yes [provider]  Multiple Vitamins-Minerals (PRESERVISION AREDS PO) Take 1 capsule by mouth 2 (two) times daily.   Yes [provider]  potassium chloride SA (K-DUR,KLOR-CON) 20 MEQ tablet Take 20 mEq by mouth daily. 04/23/16  Yes [provider]  simvastatin (ZOCOR) 10 MG tablet Take 10 mg by mouth at bedtime. 04/23/16  Yes [provider]  timolol (TIMOPTIC) 0.5 % ophthalmic solution Place 1 drop into both eyes daily.  09/20/13  Yes [provider]  warfarin (COUMADIN) 2.5 MG tablet Take 2.5-5 mg by mouth See admin instructions. Take 2.5 mg by mouth on Tuesday, Thursday, Saturday and Sunday. Take 5 mg by mouth on Monday, Wednesday and Friday. 04/23/16  Yes [provider]  alendronate (FOSAMAX) 70 MG tablet Take 70 mg by mouth once a week. Take with a full glass of water on an empty stomach.    [provider]    Allergies Patient has no known allergies.  Family History  Problem Relation Age of Onset  . Breast cancer Sister   . Throat cancer Father     Social History Social History   Tobacco Use  . Smoking status: Never Smoker  . Smokeless tobacco: Never Used  Substance Use Topics  . Alcohol use: No  . Drug use: No  Review of Systems Constitutional: No fever/chills Eyes: No visual changes. ENT: No sore throat. No stiff neck no neck pain Cardiovascular: Denies chest pain. Respiratory: + shortness of breath. Gastrointestinal:   no vomiting.  No diarrhea.  No constipation. Genitourinary: Negative for dysuria. Musculoskeletal: Negative lower extremity swelling Skin: Negative for rash. Neurological: Negative for severe headaches, focal weakness or numbness.   ____________________________________________   PHYSICAL EXAM:  VITAL SIGNS: ED Triage Vitals  Enc  Vitals Group     BP 12/07/17 1045 (!) 194/122     Pulse Rate 12/07/17 1038 (!) 170     Resp 12/07/17 1038 (!) 28     Temp 12/07/17 1038 97.9 F (36.6 C)     Temp Source 12/07/17 1038 Oral     SpO2 12/07/17 1038 93 %     Weight 12/07/17 1038 144 lb 8 oz (65.5 kg)     Height 12/07/17 1038 5\' 2"  (1.575 m)     Head Circumference --      Peak Flow --      Pain Score 12/07/17 1038 0     Pain Loc --      Pain Edu? --      Excl. in [place]? --     Constitutional: Alert oriented to name unsure of place.  Breathing very rapidly, otherwise in no acute distress Eyes: Conjunctivae are normal Head: Atraumatic HEENT: No congestion/rhinnorhea. Mucous membranes are moist.  Oropharynx non-erythematous Neck:   Nontender with no meningismus, no masses, no stridor Cardiovascular: Normal rate, regular rhythm. Grossly normal heart sounds.  Good peripheral circulation. Respiratory: Creased respiratory effort, diminished breath sounds on the left. Abdominal: Soft and nontender. No distention. No guarding no rebound Back:  There is no focal tenderness or step off.  there is no midline tenderness there are no lesions noted. there is no CVA tenderness Musculoskeletal: No lower extremity tenderness, no upper extremity tenderness. No joint effusions, no DVT signs strong distal pulses no edema Neurologic:  Normal speech and language. No gross focal neurologic deficits are appreciated.  Skin:  Skin is warm, dry and intact. No rash noted. Psychiatric: Mood and affect are anxious. Speech and behavior are normal.  ____________________________________________   LABS (all labs ordered are listed, but only abnormal results are displayed)  Labs Reviewed  CBC WITH DIFFERENTIAL/PLATELET - Abnormal; Notable for the following components:      Result Value   Neutro Abs 7.8 (*)    Monocytes Absolute 1.1 (*)    All other components within normal limits  PROTIME-INR - Abnormal; Notable for the following components:    Prothrombin Time 28.9 (*)    All other components within normal limits  COMPREHENSIVE METABOLIC PANEL - Abnormal; Notable for the following components:   Glucose, Bld 125 (*)    Alkaline Phosphatase 23 (*)    All other components within normal limits  BLOOD GAS, VENOUS - Abnormal; Notable for the following components:   Bicarbonate 31.4 (*)    Acid-Base Excess 4.7 (*)    All other components within normal limits  BRAIN NATRIURETIC PEPTIDE  TROPONIN I  DIGOXIN LEVEL  URINALYSIS, COMPLETE (UACMP) WITH MICROSCOPIC  CBG MONITORING, ED    Pertinent labs  results that were available during my care of the patient were reviewed by me and considered in my medical decision making (see chart for details). ____________________________________________  EKG  I personally interpreted any EKGs ordered by me or triage Atrial fibrillation rate 122 no acute ST elevation or depression normal axis, ____________________________________________  RADIOLOGY  Pertinent labs & imaging results that were available during my care of the patient were reviewed by me and considered in my medical decision making (see chart for details). If possible, patient and/or family made aware of any abnormal findings.  Dg Chest Port 1 View  Result Date: 12/07/2017 CLINICAL DATA:  Dyspnea EXAM: PORTABLE CHEST 1 VIEW COMPARISON:  10/18/2016 chest radiograph. FINDINGS: Stable cardiomediastinal silhouette with marked cardiomegaly. No pneumothorax. No right pleural effusion. Large left pleural effusion with right mediastinal shift. No pulmonary edema. Near complete left lung atelectasis with minimal residual aeration in the apical left upper lobe. Clear right lung. IMPRESSION: Large left pleural effusion with right mediastinal shift. Near complete left lung atelectasis with minimal residual aeration in the upper left lung. Marked cardiomegaly. Electronically Signed   By: Ilona Sorrel M.D.   On: 12/07/2017 11:03    ____________________________________________    PROCEDURES  Procedure(s) performed: None  Procedures  Critical Care performed: CRITICAL CARE Performed by: Schuyler Amor   Total critical care time: 55 minutes  Critical care time was exclusive of separately billable procedures and treating other patients.  Critical care was necessary to treat or prevent imminent or life-threatening deterioration.  Critical care was time spent personally by me on the following activities: development of treatment plan with patient and/or surrogate as well as nursing, discussions with consultants, evaluation of patient's response to treatment, examination of patient, obtaining history from patient or surrogate, ordering and performing treatments and interventions, ordering and review of laboratory studies, ordering and review of radiographic studies, pulse oximetry and re-evaluation of patient's condition.   ____________________________________________   INITIAL IMPRESSION / ASSESSMENT AND PLAN / ED COURSE  Pertinent labs & imaging results that were available during my care of the patient were reviewed by me and considered in my medical decision making (see chart for details).  Chest x-ray shows a large pleural effusion, I did discuss with the family, they at this time are thinking about whether they would want that to be tapped.  I did place the patient on BiPAP, his slow down her breathing somewhat made her more comfortable.  Respiratory rate is in the 20s this time, initially was almost 40.  She is breathing very shallowly on top of her lungs.  Unclear exactly the etiology or time course of this left-sided pleural effusion.  Patient does not appear to be otherwise septic I suspect this is possibly oncologic although is unclear.  Right lung does not show any evidence of CHF, blood pressure somewhat elevated here but will give her time to relax, I hope that that trends down.  BNP is pending troponin  is reassuring hemoglobin white count and other blood work thus far back is reassuring.  This certainly could be a reactive pulmonary infusion.  Family again verify they do not want CPR or intubation, and at this time they are trying to figure out whether they would want a possible IR guided thoracentesis in the event that is called for.  I do not think this is an unreasonable course of action for the family to take in general.  The patient herself is in no pain at this time she seems to be comfortable and she is breathing okay on the BiPAP.  We will admit her to the hospital obviously    ____________________________________________   FINAL CLINICAL IMPRESSION(S) / ED DIAGNOSES  Final diagnoses:  SOB (shortness of breath)      This chart was dictated using voice recognition software.  Despite best efforts to proofread,  errors can occur which can change meaning.      Schuyler Amor, MD 12/07/17 [phone]

## 2017-12-08 ENCOUNTER — Inpatient Hospital Stay: Payer: Medicare Other

## 2017-12-08 ENCOUNTER — Other Ambulatory Visit: Payer: Self-pay

## 2017-12-08 DIAGNOSIS — I482 Chronic atrial fibrillation: Secondary | ICD-10-CM

## 2017-12-08 DIAGNOSIS — R54 Age-related physical debility: Secondary | ICD-10-CM

## 2017-12-08 DIAGNOSIS — J942 Hemothorax: Secondary | ICD-10-CM

## 2017-12-08 LAB — BASIC METABOLIC PANEL
Anion gap: 6 (ref 5–15)
BUN: 15 mg/dL (ref 8–23)
CHLORIDE: 102 mmol/L (ref 98–111)
CO2: 29 mmol/L (ref 22–32)
CREATININE: 0.63 mg/dL (ref 0.44–1.00)
Calcium: 8.4 mg/dL — ABNORMAL LOW (ref 8.9–10.3)
GFR calc non Af Amer: >60 mL/min (ref >60)
Glucose, Bld: 101 mg/dL — ABNORMAL HIGH (ref 70–99)
Potassium: 3.5 mmol/L (ref 3.5–5.1)
Sodium: 137 mmol/L (ref 135–145)

## 2017-12-08 LAB — CBC
HEMATOCRIT: 38.7 % (ref 35.0–47.0)
HEMOGLOBIN: 13.2 g/dL (ref 12.0–16.0)
MCH: 33.5 pg (ref 26.0–34.0)
MCHC: 34 g/dL (ref 32.0–36.0)
MCV: 98.3 fL (ref 80.0–100.0)
Platelets: 181 10*3/uL (ref 150–440)
RBC: 3.94 MIL/uL (ref 3.80–5.20)
RDW: 13.8 % (ref 11.5–14.5)
WBC: 8.2 10*3/uL (ref 3.6–11.0)

## 2017-12-08 LAB — PROTIME-INR
INR: 3.31
PROTHROMBIN TIME: 33.4 s — AB (ref 11.4–15.2)

## 2017-12-08 MED ORDER — DILTIAZEM HCL 30 MG PO TABS
30.0000 mg | ORAL_TABLET | Freq: Four times a day (QID) | ORAL | Status: DC
  Administered 2017-12-08 – 2017-12-13 (×21): 30 mg via ORAL
  Filled 2017-12-08 (×21): qty 1

## 2017-12-08 MED ORDER — DIGOXIN 125 MCG PO TABS
0.0625 mg | ORAL_TABLET | Freq: Every day | ORAL | Status: DC
  Administered 2017-12-08 – 2017-12-13 (×6): 0.0625 mg via ORAL
  Filled 2017-12-08 (×6): qty 0.5

## 2017-12-08 MED ORDER — CALCIUM CARBONATE ANTACID 500 MG PO CHEW
1500.0000 mg | CHEWABLE_TABLET | Freq: Every day | ORAL | Status: DC
  Administered 2017-12-08 – 2017-12-13 (×5): 1500 mg via ORAL
  Filled 2017-12-08 (×5): qty 8

## 2017-12-08 MED ORDER — DILTIAZEM HCL 100 MG IV SOLR: 5.0000 mg/h | INTRAVENOUS | Status: DC

## 2017-12-08 NOTE — Clinical Social Work Note (Signed)
Clinical Social Work Assessment  Patient Details  Name: Kathryn [name] MRN: [id number] Date of Birth: 04-03-1919  Date of referral:  12/08/17               Reason for consult:  Other (Comment Required)(From a facility ( [facility]))                Permission sought to share information with:  Family Supports Permission granted to share information::  Yes, Verbal Permission Granted  Name::     Agency::   [facility]  Relationship::      Daughter Kathryn [name] [phone] [address] Kathryn [name] [phone]  Contact Information:   same as above  Housing/Transportation Living arrangements for the past 2 months:  [place] of Information:  Adult Children, Facility Patient Interpreter Needed:  None Criminal Activity/Legal Involvement Pertinent to Current Situation/Hospitalization:  No - Comment as needed Significant Relationships:  Adult Children, [place] Lives with:  Facility Resident Do you feel safe going back to the place where you live?  No Need for family participation in patient care:  No (Coment)  Care giving concerns: Family would like her to go to [facility]  SNF side, [place]   Social Worker assessment / plan: Kathryn [name]  is a 84 y.o. female with a known history of atrial fibrillation, history of breast cancer, glaucoma, hyperlipidemia and hypertension who is on chronic Coumadin therapy presenting with acute onset of shortness of breath and heart beating fast.   Patient had to be placed on BiPAP.  She denies any chest pains no fevers no recent weight gain or weight loss.  Does have some swelling of the lower extremity. Patient is from [facility] and has blue cross blue sheild. LCSW introduced myself to patient and family and obtained verbal consent to complete and assessment, start Fl2 and Passr number. Patient has [employer]. Patient is fairly independent with her ADLs but has experienced weakness. Patient is alert and oriented x4 and her  son Kathryn [name] is HCPOA. They family provides daily interaction at [facility] but they would like her to go to SNF side. Patient is able to return to [facility]. PT consult pending  Employment status:  Retired Forensic scientist:  Managed Medicare(BCBS) PT Recommendations:  [place] / Referral to community resources:   na  Patient/Family's Response to care: Would like patient to go to SNF side of [facility]   Patient/Family's Understanding of and Emotional Response to Diagnosis, Current Treatment, and Prognosis: [name] patient and family have clear understanding of health concerns.  Emotional Assessment Appearance:  Appears stated age Attitude/Demeanor/Rapport:  Gracious Affect (typically observed):  Accepting Orientation:  Oriented to Self, Oriented to Situation, Oriented to Place Alcohol / Substance use:  Not Applicable Psych involvement (Current and /or in the community):  No (Comment)  Discharge Needs  Concerns to be addressed:  No discharge needs identified Readmission within the last 30 days:  No Current discharge risk:  None Barriers to Discharge:  No Barriers Identified   Kathryn Reamer, LCSW 12/08/2017, 9:34 AM

## 2017-12-08 NOTE — NC FL2 (Addendum)
[place] LEVEL OF CARE SCREENING TOOL     IDENTIFICATION  Patient Name: Kathryn [name] Birthdate: 1918-06-05 Sex: female Admission Date (Current Location): 12/07/2017  [place] and [place] Number:  Engineering geologist and Address:  [facility], [address] [place], [place] [address]      Provider Number: [id number]  Attending Physician Name and Address:  Bettey Costa, MD  Relative Name and Phone Number:     [place]  Current Level of Care: Hospital Recommended Level of Care: [place] Prior Approval Number:    Date Approved/Denied:   PASRR Number: [id number]  Discharge Plan: ALF SNF    Current Diagnoses: Patient Active Problem List   Diagnosis Date Noted  . Acute respiratory failure ([place]) 12/07/2017  . Hypoxia 10/18/2016    Orientation RESPIRATION BLADDER Height & Weight     Self, Time, Situation, Place  O2 Incontinent Weight: 149 lb 4 oz (67.7 kg) Height:  5\' 2"  (157.5 cm)  BEHAVIORAL SYMPTOMS/MOOD NEUROLOGICAL BOWEL NUTRITION STATUS      Continent Diet(Normal)  AMBULATORY STATUS COMMUNICATION OF NEEDS Skin   Supervision Verbally Normal                       Personal Care Assistance Level of Assistance  Bathing, Feeding, Dressing, Total care Bathing Assistance: Independent Feeding assistance: Independent Dressing Assistance: Independent Total Care Assistance: Independent   Functional Limitations Info  Sight, Hearing, Speech Sight Info: Adequate(glasses) Hearing Info: Adequate(Hard of hearing) Speech Info: Adequate    SPECIAL CARE FACTORS FREQUENCY                       Contractures Contractures Info: Not present    Additional Factors Info  Code Status Code Status Info: DNR             Current Medications (12/08/2017):  This is the current hospital active medication list Current Facility-Administered Medications  Medication Dose Route Frequency  Provider Last Rate Last Dose  . 0.9 %  sodium chloride infusion  250 mL Intravenous PRN Tukov-Yual, Magdalene S, NP      . acetaminophen (TYLENOL) tablet 650 mg  650 mg Oral Q6H PRN Tukov-Yual, Magdalene S, NP      . calcium carbonate (TUMS - dosed in mg elemental calcium) chewable tablet 1,500 mg  1,500 mg Oral Daily Tukov-Yual, Magdalene S, NP      . digoxin (LANOXIN) tablet 0.0625 mg  0.0625 mg Oral Daily Teodoro Spray, MD      . diltiazem (CARDIZEM) tablet 30 mg  30 mg Oral Q6H Tukov-Yual, Magdalene S, NP   30 mg at 12/08/17 0608  . ferrous sulfate tablet 325 mg  325 mg Oral BID WC Tukov-Yual, Magdalene S, NP   325 mg at 12/08/17 0848  . lisinopril (PRINIVIL,ZESTRIL) tablet 20 mg  20 mg Oral Daily Tukov-Yual, Magdalene S, NP       And  . hydrochlorothiazide (MICROZIDE) capsule 12.5 mg  12.5 mg Oral Daily Tukov-Yual, Magdalene S, NP      . latanoprost (XALATAN) 0.005 % ophthalmic solution 1 drop  1 drop Both Eyes QHS Tukov-Yual, Magdalene S, NP   1 drop at 12/07/17 2129  . multivitamin with minerals tablet 1 tablet  1 tablet Oral Daily Tukov-Yual, Magdalene S, NP      . ondansetron (ZOFRAN) injection 4 mg  4 mg Intravenous Q6H PRN Tukov-Yual, Arlyss Gandy, NP      .  potassium chloride SA (K-DUR,KLOR-CON) CR tablet 20 mEq  20 mEq Oral Daily Tukov-Yual, Magdalene S, NP      . sodium chloride flush (NS) 0.9 % injection 3 mL  3 mL Intravenous Q12H Tukov-Yual, Magdalene S, NP   3 mL at 12/07/17 2133  . sodium chloride flush (NS) 0.9 % injection 3 mL  3 mL Intravenous PRN Tukov-Yual, Magdalene S, NP      . timolol (TIMOPTIC) 0.5 % ophthalmic solution 1 drop  1 drop Both Eyes Daily Tukov-Yual, Magdalene S, NP   1 drop at 12/07/17 1844  . traMADol (ULTRAM) tablet 50 mg  50 mg Oral Q6H PRN Tukov-Yual, Arlyss Gandy, NP         Discharge Medications: Please see discharge summary for a list of discharge medications.  Relevant Imaging Results:  Relevant Lab Results:   Additional Information SSN  [id number]  Joana Reamer, [place]

## 2017-12-08 NOTE — Progress Notes (Signed)
Pt alert and oriented x 4, sitting up in bed eating breakfast. Pt denies Pain and SOB at this time.On 4 L [place] and Cardizem drip 5mg . Report given to Manuela Schwartz, RN.

## 2017-12-08 NOTE — Consult Note (Signed)
Cardiology Consultation Note    Patient ID: Kathryn [name], MRN: [id number], DOB/AGE: [birth year] 84 y.o. Admit date: 12/07/2017   Date of Consult: 12/08/2017 Primary Physician: Ezequiel Kayser, MD Primary Cardiologist: Dr. Nehemiah Massed  Chief Complaint: sob Reason for Consultation: afib Requesting MD: Dr. Benjie Karvonen  HPI: Kathryn [name] is a 84 y.o. female with history of atrial fibrillation treated with rate control with Cardizem CD at 300 mg daily and anticoagulation with warfarin who was admitted with increasing shortness of breath.  She was noted to have a large pleural effusion.  There was no evidence of congestive heart failure.  Her atrial fibrillation rate is well controlled.  She was taken off of warfarin.  She underwent thoracentesis with removal of approximately 1 L.  This was port wine appearing in color.  Labs were sent.  Patient tolerated procedure well.  She feels less short of breath.  Her A. fib rate has been fairly well controlled.  She is a somewhat difficult historian.  Past Medical History:  Diagnosis Date  . A-fib ([place])   . Breast cancer ([place]) 04/20/2001   lt mastecomy  . Glaucoma   . Hyperlipidemia   . Hypertension       Surgical History:  Past Surgical History:  Procedure Laterality Date  . ABDOMINAL HYSTERECTOMY    . MASTECTOMY Left 04/20/01     Home Meds: Prior to Admission medications   Medication Sig Start Date End Date Taking? Authorizing Provider  calcium carbonate (CALCIUM 600) 1500 (600 Ca) MG TABS tablet Take 1 tablet by mouth daily.   Yes [provider]  digoxin (LANOXIN) 0.125 MG tablet Take 0.125 mg by mouth daily. 04/23/16  Yes [provider]  diltiazem (CARDIZEM CD) 300 MG 24 hr capsule Take 300 mg by mouth daily. 10/27/15  Yes [provider]  ferrous sulfate 325 (65 FE) MG tablet Take 325 mg by mouth 2 (two) times daily.   Yes [provider]  latanoprost (XALATAN) 0.005 % ophthalmic solution Place 1 drop into both eyes  at bedtime. 09/20/13  Yes [provider]  lisinopril-hydrochlorothiazide (PRINZIDE,ZESTORETIC) 20-12.5 MG tablet Take 1 tablet by mouth daily. 04/23/16  Yes [provider]  Multiple Vitamin (MULTI-VITAMINS) TABS Take 1 tablet by mouth daily.   Yes [provider]  Multiple Vitamins-Minerals (PRESERVISION AREDS PO) Take 1 capsule by mouth 2 (two) times daily.   Yes [provider]  potassium chloride SA (K-DUR,KLOR-CON) 20 MEQ tablet Take 20 mEq by mouth daily. 04/23/16  Yes [provider]  simvastatin (ZOCOR) 10 MG tablet Take 10 mg by mouth at bedtime. 04/23/16  Yes [provider]  timolol (TIMOPTIC) 0.5 % ophthalmic solution Place 1 drop into both eyes daily.  09/20/13  Yes [provider]  warfarin (COUMADIN) 2.5 MG tablet Take 2.5-5 mg by mouth See admin instructions. Take 2.5 mg by mouth on Tuesday, Thursday, Saturday and Sunday. Take 5 mg by mouth on Monday, Wednesday and Friday. 04/23/16  Yes [provider]  alendronate (FOSAMAX) 70 MG tablet Take 70 mg by mouth once a week. Take with a full glass of water on an empty stomach.    [provider]    Inpatient Medications:  . calcium carbonate  1,500 mg Oral Daily  . digoxin  0.0625 mg Oral Daily  . diltiazem  30 mg Oral Q6H  . ferrous sulfate  325 mg Oral BID WC  . lisinopril  20 mg Oral Daily   And  .  hydrochlorothiazide  12.5 mg Oral Daily  . latanoprost  1 drop Both Eyes QHS  . multivitamin with minerals  1 tablet Oral Daily  . potassium chloride SA  20 mEq Oral Daily  . sodium chloride flush  3 mL Intravenous Q12H  . timolol  1 drop Both Eyes Daily   . sodium chloride      Allergies: No Known Allergies  Social History   Socioeconomic History  . Marital status: Widowed    Spouse name: Not on file  . Number of children: Not on file  . Years of education: Not on file  . Highest education level: Not on file  Occupational History  . Not on file   Social Needs  . Financial resource strain: Not on file  . Food insecurity:    Worry: Not on file    Inability: Not on file  . Transportation needs:    Medical: Not on file    Non-medical: Not on file  Tobacco Use  . Smoking status: Never Smoker  . Smokeless tobacco: Never Used  Substance and Sexual Activity  . Alcohol use: No  . Drug use: No  . Sexual activity: Not on file  Lifestyle  . Physical activity:    Days per week: Not on file    Minutes per session: Not on file  . Stress: Not on file  Relationships  . Social connections:    Talks on phone: Not on file    Gets together: Not on file    Attends religious service: Not on file    Active member of club or organization: Not on file    Attends meetings of clubs or organizations: Not on file    Relationship status: Not on file  . Intimate partner violence:    Fear of current or ex partner: Not on file    Emotionally abused: Not on file    Physically abused: Not on file    Forced sexual activity: Not on file  Other Topics Concern  . Not on file  Social History Narrative  . Not on file     Family History  Problem Relation Age of Onset  . Breast cancer Sister   . Throat cancer Father      Review of Systems: A 12-system review of systems was performed and is negative except as noted in the HPI.  Labs: Recent Labs    12/07/17 1036  TROPONINI <0.03   Lab Results  Component Value Date   WBC 8.2 12/08/2017   HGB 13.2 12/08/2017   HCT 38.7 12/08/2017   MCV 98.3 12/08/2017   PLT 181 12/08/2017    Recent Labs  Lab 12/07/17 1036 12/07/17 1443 12/08/17 0451  NA 137  --  137  K 4.1  --  3.5  CL 100  --  102  CO2 30  --  29  BUN 15  --  15  CREATININE 0.79  --  0.63  CALCIUM 9.4  --  8.4*  PROT 7.4 6.8  --   BILITOT 0.9  --   --   ALKPHOS 23*  --   --   ALT 30  --   --   AST 36  --   --   GLUCOSE 125*  --  101*   Lab Results  Component Value Date   CHOL 127 12/12/2012   HDL 46 12/12/2012    LDLCALC 64 12/12/2012   TRIG 86 12/12/2012   No results found for: DDIMER  Radiology/Studies:  Dg Chest 1 View  Result Date: 12/07/2017 CLINICAL DATA:  Status post left-sided thoracentesis. EXAM: CHEST  1 VIEW COMPARISON:  Radiograph of same day. FINDINGS: Stable cardiomegaly. Atherosclerosis of thoracic aorta is noted. Moderate left pleural effusion is noted which is smaller compared to prior exam. No pneumothorax is noted. Calcified pleural plaques are seen in left upper lobe. Bony thorax is unremarkable. IMPRESSION: No pneumothorax status post left-sided thoracentesis. Moderate left pleural effusion is noted which is decreased compared to prior exam. Aortic Atherosclerosis (ICD10-I70.0). Electronically Signed   By: Marijo Conception, M.D.   On: 12/07/2017 14:47   Dg Chest Port 1 View  Result Date: 12/08/2017 CLINICAL DATA:  Respiratory failure. EXAM: PORTABLE CHEST 1 VIEW COMPARISON:  Radiograph of December 07, 2017. FINDINGS: Stable cardiomegaly. Atherosclerosis of thoracic aorta is noted. No pneumothorax is noted. Stable moderate left pleural effusion is noted with underlying atelectasis. Stable pleural calcifications are noted in the left upper lobe. Probable minimal right basilar subsegmental atelectasis is noted. Bony thorax is unremarkable. IMPRESSION: Stable cardiomegaly. Stable moderate left pleural effusion with probable underlying atelectasis. Aortic Atherosclerosis (ICD10-I70.0). Electronically Signed   By: Marijo Conception, M.D.   On: 12/08/2017 07:44   Dg Chest Port 1 View  Result Date: 12/07/2017 CLINICAL DATA:  Dyspnea EXAM: PORTABLE CHEST 1 VIEW COMPARISON:  10/18/2016 chest radiograph. FINDINGS: Stable cardiomediastinal silhouette with marked cardiomegaly. No pneumothorax. No right pleural effusion. Large left pleural effusion with right mediastinal shift. No pulmonary edema. Near complete left lung atelectasis with minimal residual aeration in the apical left upper lobe. Clear right  lung. IMPRESSION: Large left pleural effusion with right mediastinal shift. Near complete left lung atelectasis with minimal residual aeration in the upper left lung. Marked cardiomegaly. Electronically Signed   By: Ilona Sorrel M.D.   On: 12/07/2017 11:03    Wt Readings from Last 3 Encounters:  12/07/17 67.7 kg  10/19/16 60.8 kg    EKG: Atrial fibrillation with variable ventricular response  Physical Exam:  Blood pressure 126/69, pulse 69, temperature 98 F (36.7 C), temperature source Axillary, resp. rate (!) 22, height 5\' 2"  (1.575 m), weight 67.7 kg, SpO2 94 %. Body mass index is 27.3 kg/m. General: Well developed, well nourished, in no acute distress. Head: Normocephalic, atraumatic, sclera non-icteric, no xanthomas, nares are without discharge.  Neck: Negative for carotid bruits. JVD not elevated. Lungs: Creased breath sounds bilaterally left greater than right. Heart: Irregular irregular rhythm with a 1/6 to 2/6 systolic murmur.  No diastolic murmurs. Abdomen: Soft, non-tender, non-distended with normoactive bowel sounds. No hepatomegaly. No rebound/guarding. No obvious abdominal masses. Msk:  Strength and tone appear normal for age. Extremities: No clubbing or cyanosis. No edema.  Distal pedal pulses are 2+ and equal bilaterally. Neuro: Alert and oriented X 3. No facial asymmetry. No focal deficit. Moves all extremities spontaneously. Psych:  Responds to questions appropriately with a normal affect.     Assessment and Plan  84 year old female with atrial fibrillation admitted with shortness of breath and noted to have a large left pleural effusion.  She is status post thoracentesis with removal of approximately 1 L of fluid.  Chest x-ray shows stable cardiomegaly with a stable moderate left pleural effusion.  She has no complaints.  She is less short of breath.  Will discontinue and remain off of warfarin as her risk of bleeding appears to exceed the benefit.  We will reduce her  digoxin to 0.0625 mg daily and continue with Cardizem.  Signed, Teodoro Spray MD 12/08/2017, 9:22 AM Pager: [phone]

## 2017-12-08 NOTE — Progress Notes (Signed)
Kathryn [name] at [place] NAME: Kathryn [name]    MR#:  [id number]  DATE OF BIRTH:  1918/07/21  SUBJECTIVE:   S/p thoracentesis yesterday 1 L bloody pleural fluid was removed  Off of bipap yesterday transferred to floor this am  REVIEW OF SYSTEMS:    Review of Systems  Constitutional: Negative for fever, chills weight loss HENT: Negative for ear pain, nosebleeds, congestion, facial swelling, rhinorrhea, neck pain, neck stiffness and ear discharge.   Respiratory:+for cough, shortness of breath, NO wheezing  Cardiovascular: Negative for chest pain, palpitations and leg swelling.  Gastrointestinal: Negative for heartburn, abdominal pain, vomiting, diarrhea or consitpation Genitourinary: Negative for dysuria, urgency, frequency, hematuria Musculoskeletal: Negative for back pain or joint pain Neurological: Negative for dizziness, seizures, syncope, focal weakness,  numbness and headaches.  Hematological: Does not bruise/bleed easily.  Psychiatric/Behavioral: Negative for hallucinations, confusion, dysphoric mood    Tolerating Diet: yes      DRUG ALLERGIES:  No Known Allergies  VITALS:  Blood pressure (!) 132/47, pulse 65, temperature 98.2 F (36.8 C), temperature source Oral, resp. rate (!) 22, height 5\' 2"  (1.575 m), weight 65.2 kg, SpO2 93 %.  PHYSICAL EXAMINATION:  Constitutional: Appears well-developed and well-nourished. No distress. HENT: Normocephalic. Marland Kitchen Oropharynx is clear and moist.  Eyes: Conjunctivae and EOM are normal. PERRLA, no scleral icterus.  Neck: Normal ROM. Neck supple. No JVD. No tracheal deviation. CVS: irr, irr 2/6  murmurs, no gallops, no carotid bruit.  Pulmonary: Effort and breath sounds normal, no stridor, rhonchi, wheezes, rales.  Abdominal: Soft. BS +,  no distension, tenderness, rebound or guarding.  Musculoskeletal: Normal range of motion. No edema and no tenderness.  Neuro: Alert. CN 2-12 grossly intact.  No focal deficits. Skin: Skin is warm and dry. No rash noted. Psychiatric: Normal mood and affect.      LABORATORY PANEL:   CBC Recent Labs  Lab 12/08/17 0451  WBC 8.2  HGB 13.2  HCT 38.7  PLT 181   ------------------------------------------------------------------------------------------------------------------  Chemistries  Recent Labs  Lab 12/07/17 1036 12/08/17 0451  NA 137 137  K 4.1 3.5  CL 100 102  CO2 30 29  GLUCOSE 125* 101*  BUN 15 15  CREATININE 0.79 0.63  CALCIUM 9.4 8.4*  AST 36  --   ALT 30  --   ALKPHOS 23*  --   BILITOT 0.9  --    ------------------------------------------------------------------------------------------------------------------  Cardiac Enzymes Recent Labs  Lab 12/07/17 1036  TROPONINI <0.03   ------------------------------------------------------------------------------------------------------------------  RADIOLOGY:  Dg Chest 1 View  Result Date: 12/07/2017 CLINICAL DATA:  Status post left-sided thoracentesis. EXAM: CHEST  1 VIEW COMPARISON:  Radiograph of same day. FINDINGS: Stable cardiomegaly. Atherosclerosis of thoracic aorta is noted. Moderate left pleural effusion is noted which is smaller compared to prior exam. No pneumothorax is noted. Calcified pleural plaques are seen in left upper lobe. Bony thorax is unremarkable. IMPRESSION: No pneumothorax status post left-sided thoracentesis. Moderate left pleural effusion is noted which is decreased compared to prior exam. Aortic Atherosclerosis (ICD10-I70.0). Electronically Signed   By: Marijo Conception, M.D.   On: 12/07/2017 14:47   Dg Chest Port 1 View  Result Date: 12/08/2017 CLINICAL DATA:  Respiratory failure. EXAM: PORTABLE CHEST 1 VIEW COMPARISON:  Radiograph of December 07, 2017. FINDINGS: Stable cardiomegaly. Atherosclerosis of thoracic aorta is noted. No pneumothorax is noted. Stable moderate left pleural effusion is noted with underlying atelectasis. Stable pleural  calcifications are noted in the left upper  lobe. Probable minimal right basilar subsegmental atelectasis is noted. Bony thorax is unremarkable. IMPRESSION: Stable cardiomegaly. Stable moderate left pleural effusion with probable underlying atelectasis. Aortic Atherosclerosis (ICD10-I70.0). Electronically Signed   By: Marijo Conception, M.D.   On: 12/08/2017 07:44   Dg Chest Port 1 View  Result Date: 12/07/2017 CLINICAL DATA:  Dyspnea EXAM: PORTABLE CHEST 1 VIEW COMPARISON:  10/18/2016 chest radiograph. FINDINGS: Stable cardiomediastinal silhouette with marked cardiomegaly. No pneumothorax. No right pleural effusion. Large left pleural effusion with right mediastinal shift. No pulmonary edema. Near complete left lung atelectasis with minimal residual aeration in the apical left upper lobe. Clear right lung. IMPRESSION: Large left pleural effusion with right mediastinal shift. Near complete left lung atelectasis with minimal residual aeration in the upper left lung. Marked cardiomegaly. Electronically Signed   By: Ilona Sorrel M.D.   On: 12/07/2017 11:03     ASSESSMENT AND PLAN:   84 y/o female with history of chronic atrial fibrillation on coumadin presents to ED with SOB and found to have large left pleural effusion  1. Acute hypoxic respiratory therapy and ascending large left pleural effusion status post thoracentesis with removal of 1 L of bloody fluid Shortness of breath is improved CXR today with Atelectasis and moderate pleural effusion. Consider pleurx if this becomes symptomatic again.   2. Chronic atrial fibrillation, Coumadin has been discontinued due to bloody pleural fluid Continue digoxin for heart rate control as well as cardiazem Cardiology consultation  3. Large left pleural effusion status post thoracentesis   Follow-up and final results of cytology  4. Hyperlipidemia: Continue Zocor  Management plans discussed with the patient and she is in agreement.  CODE STATUS:  DNR  TOTAL TIME TAKING CARE OF THIS PATIENT: 27 minutes.     POSSIBLE D/C 2 days, DEPENDING ON CLINICAL CONDITION.   Darald Uzzle M.D on 12/08/2017 at 10:27 AM  Between 7am to 6pm - Pager - [phone] After 6pm go to [email or web address] - password [email or web address] [facility]  Office  [phone]  CC: Primary care physician; Ezequiel Kayser, MD  Note: This dictation was prepared with Dragon dictation along with smaller phrase technology. Any transcriptional errors that result from this process are unintentional.

## 2017-12-08 NOTE — Consult Note (Signed)
PULMONARY/CCM FOLLOW-UP NOTE   PT PROFILE: 84 y.o. female never smoker with chronic atrial fibrillation on chronic warfarin admitted to ICU/SDU with acute respiratory failure and very large left pleural effusion.  Thoracentesis 8/24 yielded bloody fluid.  Substantially improved after thoracentesis and able to come off of BiPAP.    Vitals:   12/08/17 0630 12/08/17 0700 12/08/17 0800 12/08/17 1013  BP:  134/60 126/69 (!) 132/47  Pulse: 72 86 69 65  Resp: (!) 28 (!) 28 (!) 22   Temp:   98 F (36.7 C) 98.2 F (36.8 C)  TempSrc:   Axillary Oral  SpO2: 91% 93% 94% 93%  Weight:    65.2 kg  Height:    5\' 2"  (1.575 m)     EXAM:  Gen: NAD on [place] O2 HEENT: NCAT, sclera white Neck: Supple without LAN, thyromegaly, JVD Lungs: Dullness to percussion with bronchial breath sounds approximately 1/2 way up on left Cardiovascular: IR IR, no murmurs noted Abdomen: Soft, nontender, normal BS Ext: No significant change symmetric lower extremity edema Neuro: No focal deficits  DATA:   BMP Latest Ref Rng & Units 12/08/2017 12/07/2017 10/19/2016  Glucose 70 - 99 mg/dL 101(H) 125(H) 104(H)  BUN 8 - 23 mg/dL 15 15 17   Creatinine 0.44 - 1.00 mg/dL 0.63 0.79 0.62  Sodium 135 - 145 mmol/L 137 137 141  Potassium 3.5 - 5.1 mmol/L 3.5 4.1 3.0(L)  Chloride 98 - 111 mmol/L 102 100 99(L)  CO2 22 - 32 mmol/L 29 30 33(H)  Calcium 8.9 - 10.3 mg/dL 8.4(L) 9.4 8.7(L)    CBC Latest Ref Rng & Units 12/08/2017 12/07/2017 10/18/2016  WBC 3.6 - 11.0 K/uL 8.2 10.4 9.1  Hemoglobin 12.0 - 16.0 g/dL 13.2 14.7 13.8  Hematocrit 35.0 - 47.0 % 38.7 43.1 41.1  Platelets 150 - 440 K/uL 181 210 169    CXR 8/25: Left pleural effusion, not significantly changed since post thoracentesis film  I have personally reviewed all chest radiographs reported above including CXRs and CT chest unless otherwise indicated  IMPRESSION:   Advanced age Chronic atrial fibrillation on chronic anticoagulation (warfarin) Very large bloody L  pleural effusion  DISCUSSION: The bloody pleural effusion might be due to an undefined malignant process.  Alternatively, it might simply be related to chronic anticoagulation in a very elderly woman who appears to have chronic pleural disease (chronic pleural calcifications).  PLAN:  Transferred to [place] floor with cardiac monitoring Continue supplemental oxygen to maintain SPO2 >90% Follow-up on pleural fluid cytology I strongly urged that she not be placed back on warfarin I doubt that she is obtaining any meaningful benefit from statin therapy (at 84 years of age) and have discontinued it I discussed with Dr. Ubaldo Glassing who concurs with the above and will see her in consultation  After transfer, PCCM will sign off. Please call if we can be of further assistance. One more chest x-ray at the time of discharge might be warranted to reevaluate the left pleural effusion   Merton Border, MD PCCM service Mobile [phone] Pager [phone] 12/08/2017 11:26 AM

## 2017-12-08 NOTE — Progress Notes (Signed)
Pt accepted in transfer from [facility]. She is alert and oriented. Painfree. No resp distress. Warmed blankets provided.

## 2017-12-09 ENCOUNTER — Inpatient Hospital Stay: Payer: Medicare Other

## 2017-12-09 LAB — PROTIME-INR
INR: 2.42
Prothrombin Time: 26.1 seconds — ABNORMAL HIGH (ref 11.4–15.2)

## 2017-12-09 MED ORDER — CEFUROXIME AXETIL 250 MG PO TABS
250.0000 mg | ORAL_TABLET | Freq: Two times a day (BID) | ORAL | Status: AC
  Administered 2017-12-09 – 2017-12-11 (×6): 250 mg via ORAL
  Filled 2017-12-09 (×6): qty 1

## 2017-12-09 NOTE — Progress Notes (Signed)
[place] at [place] NAME: Kathryn [name]    MR#:  [id number]  DATE OF BIRTH:  1918-05-08  SUBJECTIVE:   Patient here due to shortness of breath secondary to a large left-sided pleural effusion.  Status post thoracentesis 2 days ago with 1 L of blood-tinged fluid removed.  Patient was having some tachypnea today and therefore repeat x-ray showing reaccumulation of the fluid.  Fluid is exudative in nature but cytology is still pending.  REVIEW OF SYSTEMS:    Review of Systems  Constitutional: Negative for chills and fever.  HENT: Negative for congestion and tinnitus.   Eyes: Negative for blurred vision and double vision.  Respiratory: Positive for shortness of breath. Negative for cough and wheezing.   Cardiovascular: Negative for chest pain, orthopnea and PND.  Gastrointestinal: Negative for abdominal pain, diarrhea, nausea and vomiting.  Genitourinary: Negative for dysuria and hematuria.  Neurological: Positive for weakness (Generalized). Negative for dizziness, sensory change and focal weakness.  All other systems reviewed and are negative.   Nutrition: Regular Tolerating Diet: yes Tolerating PT: Await Eval.   DRUG ALLERGIES:  No Known Allergies  VITALS:  Blood pressure (!) 145/77, pulse (!) 106, temperature 98.4 F (36.9 C), temperature source Oral, resp. rate (!) 22, height 5\' 2"  (1.575 m), weight 65.2 kg, SpO2 93 %.  PHYSICAL EXAMINATION:   Physical Exam  GENERAL:  84 y.o.-year-old patient lying in bed lethargic and in mild Resp. Distress. EYES: Pupils equal, round, reactive to light and accommodation. No scleral icterus. Extraocular muscles intact.  HEENT: Head atraumatic, normocephalic. Oropharynx and nasopharynx clear.  NECK:  Supple, no jugular venous distention. No thyroid enlargement, no tenderness.  LUNGS: Poor a/E on left mid-to lower lung fields, no wheezing, rales, rhonchi. + use of accessory muscles of respiration.   CARDIOVASCULAR: S1, S2 normal. No murmurs, rubs, or gallops.  ABDOMEN: Soft, nontender, nondistended. Bowel sounds present. No organomegaly or mass.  EXTREMITIES: No cyanosis, clubbing or edema b/l.    NEUROLOGIC: Cranial nerves II through XII are intact. No focal Motor or sensory deficits b/l.  Globally weak.  PSYCHIATRIC: The patient is alert and oriented x 3.  SKIN: No obvious rash, lesion, or ulcer.    LABORATORY PANEL:   CBC Recent Labs  Lab 12/08/17 0451  WBC 8.2  HGB 13.2  HCT 38.7  PLT 181   ------------------------------------------------------------------------------------------------------------------  Chemistries  Recent Labs  Lab 12/07/17 1036 12/08/17 0451  NA 137 137  K 4.1 3.5  CL 100 102  CO2 30 29  GLUCOSE 125* 101*  BUN 15 15  CREATININE 0.79 0.63  CALCIUM 9.4 8.4*  AST 36  --   ALT 30  --   ALKPHOS 23*  --   BILITOT 0.9  --    ------------------------------------------------------------------------------------------------------------------  Cardiac Enzymes Recent Labs  Lab 12/07/17 1036  TROPONINI <0.03   ------------------------------------------------------------------------------------------------------------------  RADIOLOGY:  Dg Chest Port 1 View  Result Date: 12/09/2017 CLINICAL DATA:  Pleural effusion, weakness, cough, shortness of breath, congestion, history breast cancer, atrial fibrillation EXAM: PORTABLE CHEST 1 VIEW COMPARISON:  Portable exam 0932 hours compared 12/08/2017 FINDINGS: Enlargement of cardiac silhouette. Atherosclerotic calcification aorta. Mediastinal contours and pulmonary vascularity normal. Persistent moderate to large LEFT pleural effusion with LEFT basilar atelectasis. Extensive pleural calcification in the upper LEFT hemithorax again seen, minimally at RIGHT apex. Probable small RIGHT pleural effusion. Scattered LEFT upper lobe and questionable developing RIGHT basilar infiltrate. No pneumothorax. Bones  demineralized. IMPRESSION: Persistent moderate to  large LEFT pleural effusion and basilar atelectasis. Question developing infiltrate in LEFT upper lobe and at RIGHT base. Enlargement of cardiac silhouette. Electronically Signed   By: Lavonia Dana M.D.   On: 12/09/2017 09:43   Dg Chest Port 1 View  Result Date: 12/08/2017 CLINICAL DATA:  Respiratory failure. EXAM: PORTABLE CHEST 1 VIEW COMPARISON:  Radiograph of December 07, 2017. FINDINGS: Stable cardiomegaly. Atherosclerosis of thoracic aorta is noted. No pneumothorax is noted. Stable moderate left pleural effusion is noted with underlying atelectasis. Stable pleural calcifications are noted in the left upper lobe. Probable minimal right basilar subsegmental atelectasis is noted. Bony thorax is unremarkable. IMPRESSION: Stable cardiomegaly. Stable moderate left pleural effusion with probable underlying atelectasis. Aortic Atherosclerosis (ICD10-I70.0). Electronically Signed   By: Marijo Conception, M.D.   On: 12/08/2017 07:44     ASSESSMENT AND PLAN:   84 year old female with past medical history of essential hypertension, atrial fibrillation, hyperlipidemia, glaucoma, history of breast cancer who presents to the hospital due to shortness of breath and noted to have a large left-sided pleural effusion.  1.  Acute respiratory failure with hypoxia-secondary to the pleural effusion as mentioned. - Status post thoracentesis this past weekend with 1 L of blood-tinged fluid removed.  Continue O2 supplementation.  2.  Pleural effusion- source of patient's worsening respiratory failure.  This is an exudative effusion. - The exact etiology of the effusion is not clear.  Cytology on the fluid is currently pending. - Repeat x-ray this morning shows reaccumulation of the fluid. -Discussed with family and will await cytology results before deciding on repeating a thoracentesis versus pigtail catheter versus Pleurx catheter.  3.  Urinary tract infection-based off  a urinalysis.  Patient is clinically asymptomatic. -We will treat with oral Ceftin for 3 days.  4.  History of chronic atrial fibrillation-rate controlled.  Continue digoxin, Cardizem. -Patient was on Coumadin but given the blood-tinged pleural effusion will DC Coumadin indefinitely as per cardiology.  5.  Essential hypertension-continue lisinopril, hydrochlorothiazide, Cardizem.  6.  Glaucoma-continue latanoprost, Timoptic eyedrops.   All the records are reviewed and case discussed with Care Management/Social Worker. Management plans discussed with the patient, family and they are in agreement.  CODE STATUS: DNR  DVT Prophylaxis: Coumadin  TOTAL TIME TAKING CARE OF THIS PATIENT: 35 minutes.   POSSIBLE D/C IN 2-3 DAYS, DEPENDING ON CLINICAL CONDITION.   Henreitta Leber M.D on 12/09/2017 at 3:15 PM  Between 7am to 6pm - Pager - [phone]  After 6pm go to [email or web address] - [employer] [facility]  Office  [phone]  CC: Primary care physician; Ezequiel Kayser, MD

## 2017-12-09 NOTE — Progress Notes (Signed)
   [place] at [place] Hospital Day: 2 days Kathryn [name] is a 84 y.o. female presenting with Atrial Fibrillation and Shortness of Breath .   Patient has a large left-sided pleural effusion status post thoracentesis 2 days ago and now has reaccumulation of her fluid.  Possibly consistent with malignancy, given her advanced age and comorbidities had an extensive discussion with the patient's family about which way to proceed her plan of care once her cytologies are back.  Family is leaning towards hospice or palliative care if her pleural effusion is truly malignant in nature.  Continue current care and patient is a DNR now.  Advance care planning discussed with patient  with additional Family at bedside. All questions in regards to overall condition and expected prognosis answered. The decision was made to continue current code status  CODE STATUS: dnr Time spent: 16 minutes

## 2017-12-10 ENCOUNTER — Other Ambulatory Visit: Payer: Self-pay | Admitting: Internal Medicine

## 2017-12-10 LAB — BLOOD GAS, VENOUS
ACID-BASE EXCESS: 4.7 mmol/L — AB (ref 0.0–2.0)
BICARBONATE: 31.4 mmol/L — AB (ref 20.0–28.0)
PCO2 VEN: 53 mmHg (ref 44.0–60.0)
PH VEN: 7.38 (ref 7.250–7.430)
Patient temperature: 37

## 2017-12-10 LAB — PROTIME-INR
INR: 2.02
Prothrombin Time: 22.7 seconds — ABNORMAL HIGH (ref 11.4–15.2)

## 2017-12-10 LAB — CYTOLOGY - NON PAP

## 2017-12-10 MED ORDER — VITAMIN K1 10 MG/ML IJ SOLN
5.0000 mg | Freq: Once | INTRAMUSCULAR | Status: AC
  Administered 2017-12-10: 5 mg via SUBCUTANEOUS
  Filled 2017-12-10: qty 0.5

## 2017-12-10 MED ORDER — FERROUS SULFATE 325 (65 FE) MG PO TABS
325.0000 mg | ORAL_TABLET | Freq: Two times a day (BID) | ORAL | Status: DC
  Administered 2017-12-10 – 2017-12-13 (×5): 325 mg via ORAL
  Filled 2017-12-10 (×6): qty 1

## 2017-12-10 MED ORDER — ORAL CARE MOUTH RINSE
15.0000 mL | Freq: Two times a day (BID) | OROMUCOSAL | Status: DC
  Administered 2017-12-10 – 2017-12-11 (×3): 15 mL via OROMUCOSAL

## 2017-12-10 NOTE — Progress Notes (Signed)
[place] at [place] NAME: Kathryn [name]    MR#:  [id number]  DATE OF BIRTH:  June 28, 1918  SUBJECTIVE:   No acute events overnight, denies any worsening shortness of breath or any acute pain.  REVIEW OF SYSTEMS:    Review of Systems  Constitutional: Negative for chills and fever.  HENT: Negative for congestion and tinnitus.   Eyes: Negative for blurred vision and double vision.  Respiratory: Negative for cough, shortness of breath and wheezing.   Cardiovascular: Negative for chest pain, orthopnea and PND.  Gastrointestinal: Negative for abdominal pain, diarrhea, nausea and vomiting.  Genitourinary: Negative for dysuria and hematuria.  Neurological: Positive for weakness (Generalized). Negative for dizziness, sensory change and focal weakness.  All other systems reviewed and are negative.   Nutrition: Regular Tolerating Diet: yes Tolerating PT: Await Eval.   DRUG ALLERGIES:  No Known Allergies  VITALS:  Blood pressure (!) 153/70, pulse 97, temperature 98.4 F (36.9 C), temperature source Oral, resp. rate 18, height 5\' 2"  (1.575 m), weight 65.2 kg, SpO2 95 %.  PHYSICAL EXAMINATION:   Physical Exam  GENERAL:  84 y.o.-year-old patient lying in bed lethargic and in mild Resp. Distress. EYES: Pupils equal, round, reactive to light and accommodation. No scleral icterus. Extraocular muscles intact.  HEENT: Head atraumatic, normocephalic. Oropharynx and nasopharynx clear.  NECK:  Supple, no jugular venous distention. No thyroid enlargement, no tenderness.  LUNGS: Poor a/E on left mid-to lower lung fields, no wheezing, rales, rhonchi. + use of accessory muscles of respiration.  CARDIOVASCULAR: S1, S2 normal. No murmurs, rubs, or gallops.  ABDOMEN: Soft, nontender, nondistended. Bowel sounds present. No organomegaly or mass.  EXTREMITIES: No cyanosis, clubbing or edema b/l.    NEUROLOGIC: Cranial nerves II through XII are intact. No focal  Motor or sensory deficits b/l.  Globally weak.  PSYCHIATRIC: The patient is alert and oriented x 3.  SKIN: No obvious rash, lesion, or ulcer.    LABORATORY PANEL:   CBC Recent Labs  Lab 12/08/17 0451  WBC 8.2  HGB 13.2  HCT 38.7  PLT 181   ------------------------------------------------------------------------------------------------------------------  Chemistries  Recent Labs  Lab 12/07/17 1036 12/08/17 0451  NA 137 137  K 4.1 3.5  CL 100 102  CO2 30 29  GLUCOSE 125* 101*  BUN 15 15  CREATININE 0.79 0.63  CALCIUM 9.4 8.4*  AST 36  --   ALT 30  --   ALKPHOS 23*  --   BILITOT 0.9  --    ------------------------------------------------------------------------------------------------------------------  Cardiac Enzymes Recent Labs  Lab 12/07/17 1036  TROPONINI <0.03   ------------------------------------------------------------------------------------------------------------------  RADIOLOGY:  Dg Chest Port 1 View  Result Date: 12/09/2017 CLINICAL DATA:  Pleural effusion, weakness, cough, shortness of breath, congestion, history breast cancer, atrial fibrillation EXAM: PORTABLE CHEST 1 VIEW COMPARISON:  Portable exam 0932 hours compared 12/08/2017 FINDINGS: Enlargement of cardiac silhouette. Atherosclerotic calcification aorta. Mediastinal contours and pulmonary vascularity normal. Persistent moderate to large LEFT pleural effusion with LEFT basilar atelectasis. Extensive pleural calcification in the upper LEFT hemithorax again seen, minimally at RIGHT apex. Probable small RIGHT pleural effusion. Scattered LEFT upper lobe and questionable developing RIGHT basilar infiltrate. No pneumothorax. Bones demineralized. IMPRESSION: Persistent moderate to large LEFT pleural effusion and basilar atelectasis. Question developing infiltrate in LEFT upper lobe and at RIGHT base. Enlargement of cardiac silhouette. Electronically Signed   By: Lavonia Dana M.D.   On: 12/09/2017 09:43      ASSESSMENT AND PLAN:  84 year old female with past medical history of essential hypertension, atrial fibrillation, hyperlipidemia, glaucoma, history of breast cancer who presents to the hospital due to shortness of breath and noted to have a large left-sided pleural effusion.  1.  Acute respiratory failure with hypoxia-secondary to the pleural effusion as mentioned. - Status post thoracentesis this past weekend with 1 L of blood-tinged fluid removed.  Continue O2 supplementation.  2.  Pleural effusion- source of patient's worsening respiratory failure.  This is an exudative effusion. - Called pathology earlier this afternoon and as per them patient's initial cytology is positive for malignant cells.  Further testing/immunohistochemistry testing still pending. - Given the fact that the patient's pleural effusions with malignant she would benefit from palliative drainage and therefore plan for IR guided Pleurx catheter placement tomorrow. - We will also consult palliative care for hospice needs at home. Discussed this extensively with the patient's family at bedside.  3.  Urinary tract infection-based off a urinalysis.  Patient is clinically asymptomatic. -We will treat with oral Ceftin for 3 days.  4.  History of chronic atrial fibrillation-rate controlled.  Continue digoxin, Cardizem. -Patient was on Coumadin but given the blood-tinged pleural effusion will DC Coumadin indefinitely as per cardiology.  5.  Essential hypertension-continue lisinopril, hydrochlorothiazide, Cardizem.  6.  Glaucoma-continue latanoprost, Timoptic eyedrops.  7. Coagulapathy -to Coumadin, will give vitamin K to reverse coagulopathy and plan for Pleurx catheter placement tomorrow  Palliative Care consult for Hospice needs.   All the records are reviewed and case discussed with Care Management/Social Worker. Management plans discussed with the patient, family and they are in agreement.  CODE STATUS:  DNR  DVT Prophylaxis: Coumadin  TOTAL TIME TAKING CARE OF THIS PATIENT: 35 minutes.   POSSIBLE D/C IN 1-2 DAYS, DEPENDING ON CLINICAL CONDITION.   Henreitta Leber M.D on 12/10/2017 at 3:28 PM  Between 7am to 6pm - Pager - [phone]  After 6pm go to [email or web address] - [employer] [facility]  Office  [phone]  CC: Primary care physician; Ezequiel Kayser, MD

## 2017-12-10 NOTE — Progress Notes (Addendum)
Palliative consult received and chart reviewed. Family not available today. Spoke with patient's son, Amirra Herling, via telephone and he requests we have family meeting tomorrow (8/28) around 67 or 12. Will plan on discussing goals of care at that time.  Thank you for this consult.  NO CHARGE  Juel Burrow, DNP, [facility] Palliative Medicine Team Team Phone # [phone]  Pager # [phone]

## 2017-12-10 NOTE — Care Management Important Message (Signed)
Copy of signed IM left with patient in room.  

## 2017-12-10 NOTE — Care Management Note (Signed)
Case Management Note  Patient Details  Name: Kathryn [name] MRN: [id number] Date of Birth: Mar 26, 1919  Subjective/Objective:                    Action/Plan: Transferred from ICU on 8-25.  Palliative consult received.  Family meeting tomorrow with son, Fritz Pickerel 11:00-12:00.  Heart rate improved but requiring acute O2 at 5L.  Expected Discharge Date:                  Expected Discharge Plan:     In-House Referral:     Discharge planning Services     Post Acute Care Choice:    Choice offered to:     DME Arranged:    DME Agency:     HH Arranged:    HH Agency:     Status of Service:     If discussed at [employer], dates discussed:    Additional Comments:  Elza Rafter, RN 12/10/2017, 3:50 PM

## 2017-12-10 NOTE — Plan of Care (Signed)
  Problem: Coping: Goal: Level of anxiety will decrease Outcome: Progressing   Problem: Pain Managment: Goal: General experience of comfort will improve Outcome: Progressing Note:  No complaints of pain this shift.   Problem: Safety: Goal: Ability to remain free from injury will improve Outcome: Progressing

## 2017-12-11 ENCOUNTER — Other Ambulatory Visit: Payer: Medicare Other

## 2017-12-11 ENCOUNTER — Other Ambulatory Visit: Payer: Self-pay | Admitting: Physician Assistant

## 2017-12-11 DIAGNOSIS — Z7189 Other specified counseling: Secondary | ICD-10-CM

## 2017-12-11 DIAGNOSIS — Z515 Encounter for palliative care: Secondary | ICD-10-CM

## 2017-12-11 DIAGNOSIS — J910 Malignant pleural effusion: Secondary | ICD-10-CM

## 2017-12-11 DIAGNOSIS — R0602 Shortness of breath: Secondary | ICD-10-CM

## 2017-12-11 LAB — CBC
HCT: 41 % (ref 35.0–47.0)
HEMOGLOBIN: 13.9 g/dL (ref 12.0–16.0)
MCH: 33.3 pg (ref 26.0–34.0)
MCHC: 33.8 g/dL (ref 32.0–36.0)
MCV: 98.5 fL (ref 80.0–100.0)
Platelets: 194 10*3/uL (ref 150–440)
RBC: 4.16 MIL/uL (ref 3.80–5.20)
RDW: 13.4 % (ref 11.5–14.5)
WBC: 13.1 10*3/uL — AB (ref 3.6–11.0)

## 2017-12-11 LAB — PROTIME-INR
INR: 1.35
INR: 1.24
PROTHROMBIN TIME: 16.6 s — AB (ref 11.4–15.2)
PROTHROMBIN TIME: 15.5 s — AB (ref 11.4–15.2)

## 2017-12-11 LAB — BODY FLUID CULTURE: Culture: NO GROWTH

## 2017-12-11 LAB — APTT: APTT: 44 s — AB (ref 24–36)

## 2017-12-11 MED ORDER — SODIUM CHLORIDE 0.9 % IV SOLN: INTRAVENOUS | Status: DC

## 2017-12-11 MED ORDER — CEFAZOLIN SODIUM-DEXTROSE 2-4 GM/100ML-% IV SOLN
2.0000 g | Freq: Once | INTRAVENOUS | Status: AC
  Administered 2017-12-12: 2 g via INTRAVENOUS
  Filled 2017-12-11: qty 100

## 2017-12-11 NOTE — Progress Notes (Signed)
New referral for outpatient Palliative to follow at [place] received from [place] following a Palliative Medicine consult. Patient information faxed to referral. Flo Shanks RN, BSN, [facility] [place], hospital  Liaison [phone]

## 2017-12-11 NOTE — Progress Notes (Signed)
[place] at [place] NAME: Kathryn [name]    MR#:  [id number]  DATE OF BIRTH:  1918/04/29  SUBJECTIVE:   No acute events overnight, denies any worsening shortness of breath or any acute pain.  REVIEW OF SYSTEMS:    Review of Systems  Constitutional: Negative for chills and fever.  HENT: Negative for congestion and tinnitus.   Eyes: Negative for blurred vision and double vision.  Respiratory: Negative for cough, shortness of breath and wheezing.   Cardiovascular: Negative for chest pain, orthopnea and PND.  Gastrointestinal: Negative for abdominal pain, diarrhea, nausea and vomiting.  Genitourinary: Negative for dysuria and hematuria.  Neurological: Positive for weakness (Generalized). Negative for dizziness, sensory change and focal weakness.  All other systems reviewed and are negative.   Nutrition: Regular Tolerating Diet: yes Tolerating PT: Await Eval.   DRUG ALLERGIES:  No Known Allergies  VITALS:  Blood pressure (!) 147/74, pulse (!) 103, temperature 97.8 F (36.6 C), temperature source Oral, resp. rate 16, height 5\' 2"  (1.575 m), weight 65.2 kg, SpO2 95 %.  PHYSICAL EXAMINATION:   Physical Exam  GENERAL:  84 y.o.-year-old patient lying in bed lethargic and in mild Resp. Distress. EYES: Pupils equal, round, reactive to light and accommodation. No scleral icterus. Extraocular muscles intact.  HEENT: Head atraumatic, normocephalic. Oropharynx and nasopharynx clear.  NECK:  Supple, no jugular venous distention. No thyroid enlargement, no tenderness.  LUNGS: Poor a/E on left mid-to lower lung fields, no wheezing, rales, rhonchi. + use of accessory muscles of respiration.  CARDIOVASCULAR: S1, S2 normal. No murmurs, rubs, or gallops.  ABDOMEN: Soft, nontender, nondistended. Bowel sounds present. No organomegaly or mass.  EXTREMITIES: No cyanosis, clubbing or edema b/l.    NEUROLOGIC: Cranial nerves II through XII are intact. No  focal Motor or sensory deficits b/l.  Globally weak.  PSYCHIATRIC: The patient is alert and oriented x 3.  SKIN: No obvious rash, lesion, or ulcer.    LABORATORY PANEL:   CBC Recent Labs  Lab 12/08/17 0451  WBC 8.2  HGB 13.2  HCT 38.7  PLT 181   ------------------------------------------------------------------------------------------------------------------  Chemistries  Recent Labs  Lab 12/07/17 1036 12/08/17 0451  NA 137 137  K 4.1 3.5  CL 100 102  CO2 30 29  GLUCOSE 125* 101*  BUN 15 15  CREATININE 0.79 0.63  CALCIUM 9.4 8.4*  AST 36  --   ALT 30  --   ALKPHOS 23*  --   BILITOT 0.9  --    ------------------------------------------------------------------------------------------------------------------  Cardiac Enzymes Recent Labs  Lab 12/07/17 1036  TROPONINI <0.03   ------------------------------------------------------------------------------------------------------------------  RADIOLOGY:  No results found.   ASSESSMENT AND PLAN:   84 year old female with past medical history of essential hypertension, atrial fibrillation, hyperlipidemia, glaucoma, history of breast cancer who presents to the hospital due to shortness of breath and noted to have a large left-sided pleural effusion.  1.  Acute respiratory failure with hypoxia-secondary to the pleural effusion as mentioned. - Status post thoracentesis this past weekend with 1 L of blood-tinged fluid removed.  Continue O2 supplementation.  2.  Pleural effusion- source of patient's worsening respiratory failure.  This is an exudative effusion. Per pathology report, patient's initial cytology is positive for malignant cells.  Further testing/immunohistochemistry testing still pending. - Given the fact that the patient's respiratory failure and pleural effusions with malignant she would benefit from palliative drainage and therefore plan for IR guided Pleurx catheter placement today to relieve respiratory  distress.  3.  Urinary tract infection-based off a urinalysis.  Patient is clinically asymptomatic. continue oral Ceftin for 3 days.  4.  History of chronic atrial fibrillation-rate controlled.  Continue digoxin, Cardizem. -Patient was on Coumadin but given the blood-tinged pleural effusion, discontinued Coumadin indefinitely as per cardiology.  5.  Essential hypertension-continue lisinopril, hydrochlorothiazide, Cardizem.  6.  Glaucoma-continue latanoprost, Timoptic eyedrops.  7. Coagulapathy -to Coumadin, given vitamin K to reverse coagulopathy and plan for Pleurx catheter placement today.  Discussed with the patient's multiple family members about hospice care at home.  I discussed with palliative Care staff.  All the records are reviewed and case discussed with Care Management/Social Worker. Management plans discussed with the patient, family and they are in agreement.  CODE STATUS: DNR  DVT Prophylaxis: Coumadin  TOTAL TIME TAKING CARE OF THIS PATIENT: 36 minutes.   POSSIBLE D/C IN 1-2 DAYS, DEPENDING ON CLINICAL CONDITION.   Demetrios Loll M.D on 12/11/2017 at 12:16 PM  Between 7am to 6pm - Pager - [phone]  After 6pm go to [email or web address] - [employer] [facility]  Office  [phone]  CC: Primary care physician; Ezequiel Kayser, MD

## 2017-12-11 NOTE — Care Management (Addendum)
Barrier- 02 requirements remain high. effusion is positive for malignant cells. For palliative cath placement today.  Palliative is to meet with patient family today regarding disposition. It has been verbally reported patient will discharge home with hospice but this has not been cofirmed

## 2017-12-11 NOTE — Progress Notes (Signed)
PT Cancellation Note  Patient Details Name: Kathryn [name] MRN: [id number] DOB: 02-22-1919   Cancelled Treatment:    Reason Eval/Treat Not Completed: Other (comment).  PT consult received.  Chart reviewed.  Discussed pt case with Elicia Lamp from Palliative Care (pt/family pursuing rehab option).  Plan for procedure tomorrow.  Attempted eval today but pt declined therapy d/t wanting to eat first--had not eaten anything since last night and was waiting for her meal (nurse notified).  Will re-attempt PT evaluation at a later date/time as appropriate.  Leitha Bleak, PT 12/11/17, 4:27 PM [phone]

## 2017-12-11 NOTE — Consult Note (Signed)
Consultation Note Date: 12/11/2017   Patient Name: Kathryn [name]  DOB: [birth year]  MRN: [id number]  Age / Sex: 84 y.o., female  PCP: Ezequiel Kayser, MD Referring Physician: Demetrios Loll, MD  Reason for Consultation: Establishing goals of care and Hospice Evaluation  HPI/Patient Profile: 83 y.o. female  with past medical history of HTN, a fib, HLD, glaucoma, and breast cancer admitted on 12/07/2017 with shortness of breath and a fib. Pt found to have large left pleural effusion. S/p thoracentesis with 1 L of blood-tinged fluid removed. Fluid positive for malignant cells. Planning for Pleurx catheter placement prior to discharge. PMT consulted for [place] and hospice evaluation.   Clinical Assessment and Goals of Care: I have reviewed medical records including EPIC notes, labs and imaging, received report from RN, assessed the patient and then met at the bedside along with patient's 4 children to discuss diagnosis prognosis, GOC, EOL wishes, disposition and options.  I introduced Palliative Medicine as specialized medical care for people living with serious illness. It focuses on providing relief from the symptoms and stress of a serious illness. The goal is to improve quality of life for both the patient and the family.  We discussed a brief life review of the patient. They tell me patient is widowed, has 4 children. She has spent the last 12 years in independent living - moved here after a fall.   As far as functional and nutritional status, they tell they have noticed a functional decline in the past 2 years. They tell me she has required a walker for ambulation and has had a few falls. Report a good appetite and good cognitive status.    We discussed her current illness and what it means in the larger context of her on-going co-morbidities. Specifically discussed malignant cells in pleural fluid and recurrence of pleural effusion. Natural disease trajectory  and expectations at EOL were discussed.  I attempted to elicit values and goals of care important to the patient. Family feel that patient would want to focus on quality of life and not pursue aggressive medical therapies.    The difference between aggressive medical intervention and comfort care was considered in light of the patient's goals of care. They are interested in focusing on comfort.   Advanced directives, concepts specific to code status, artifical feeding and hydration, and rehospitalization were considered and discussed. Confirmed DNR. Patient would not want any artificial prolongation of life.   Hospice and Palliative Care services outpatient were explained and offered. Family is interested in hospice care; however, would like patient to go to SNF rehab at [facility]; therefore, we discussed patient returning with palliative care follow-up and then transitioning to hospice care when appropriate.   Family requested I go and speak to patient about above information. Shared with her the cytology results of pleural fluid. She tells me her biggest concern is not returning to independent living however she also understands a need for higher level of care. We discussed hospice care when appropriate and she agrees to this.   Questions and concerns were addressed. The family was encouraged to call with questions or concerns.   Primary Decision Maker PATIENT joined by children    SUMMARY OF RECOMMENDATIONS   - patient and family interested in hospice care; however they want patient to receive short term rehab so plan for patient to go to rehab with palliative follow-up and transition to hospice as appropriate - agree to palliative pleurx placement - no other aggressive medical interventions -  DNR - PT eval  Code Status/Advance Care Planning:  DNR   Symptom Management:   Plan for pleurx placement for shortness of breath  Low dose oral morphine as needed for shortness of  breath  Palliative Prophylaxis:   Aspiration, Bowel Regimen, Delirium Protocol, Frequent Pain Assessment and Oral Care  Additional Recommendations (Limitations, Scope, Preferences):  Avoid Hospitalization, Initiate Comfort Feeding, No Chemotherapy and No Radiation  Psycho-social/Spiritual:   Desire for further Chaplaincy support:no  Additional Recommendations: Education on Hospice  Prognosis:   Unable to determine  Discharge Planning: Loop for rehab with Palliative care service follow-up      Primary Diagnoses: Present on Admission: . Acute respiratory failure ([place])   I have reviewed the medical record, interviewed the patient and family, and examined the patient. The following aspects are pertinent.  Past Medical History:  Diagnosis Date  . A-fib ([place])   . Breast cancer ([place]) 04/20/2001   lt mastecomy  . Glaucoma   . Hyperlipidemia   . Hypertension    Social History   Socioeconomic History  . Marital status: Widowed    Spouse name: Not on file  . Number of children: Not on file  . Years of education: Not on file  . Highest education level: Not on file  Occupational History  . Not on file  Social Needs  . Financial resource strain: Not on file  . Food insecurity:    Worry: Not on file    Inability: Not on file  . Transportation needs:    Medical: Not on file    Non-medical: Not on file  Tobacco Use  . Smoking status: Never Smoker  . Smokeless tobacco: Never Used  Substance and Sexual Activity  . Alcohol use: No  . Drug use: No  . Sexual activity: Not on file  Lifestyle  . Physical activity:    Days per week: Not on file    Minutes per session: Not on file  . Stress: Not on file  Relationships  . Social connections:    Talks on phone: Not on file    Gets together: Not on file    Attends religious service: Not on file    Active member of club or organization: Not on file    Attends meetings of clubs or organizations: Not on  file    Relationship status: Not on file  Other Topics Concern  . Not on file  Social History Narrative  . Not on file   Family History  Problem Relation Age of Onset  . Breast cancer Sister   . Throat cancer Father    Scheduled Meds: . calcium carbonate  1,500 mg Oral Daily  . cefUROXime  250 mg Oral BID WC  . digoxin  0.0625 mg Oral Daily  . diltiazem  30 mg Oral Q6H  . ferrous sulfate  325 mg Oral BID WC  . lisinopril  20 mg Oral Daily   And  . hydrochlorothiazide  12.5 mg Oral Daily  . latanoprost  1 drop Both Eyes QHS  . mouth rinse  15 mL Mouth Rinse BID  . multivitamin with minerals  1 tablet Oral Daily  . potassium chloride SA  20 mEq Oral Daily  . sodium chloride flush  3 mL Intravenous Q12H  . timolol  1 drop Both Eyes Daily   Continuous Infusions: . sodium chloride     PRN Meds:.sodium chloride, acetaminophen **OR** [DISCONTINUED] acetaminophen, [DISCONTINUED] ondansetron **OR** ondansetron (ZOFRAN) IV, sodium chloride flush, traMADol  No Known Allergies Review of Systems  Constitutional: Positive for activity change and fatigue. Negative for appetite change.  Respiratory: Negative for shortness of breath.   Neurological: Positive for weakness.    Physical Exam  Constitutional: She is oriented to person, place, and time. She appears well-nourished. She appears ill. No distress.  HENT:  Head: Normocephalic and atraumatic.  Cardiovascular: Normal rate.  Pulmonary/Chest: Effort normal and breath sounds normal. No respiratory distress.  Abdominal: Soft.  Musculoskeletal:       Right lower leg: Normal.       Left lower leg: Normal.  Neurological: She is alert and oriented to person, place, and time.  Skin: Skin is warm and dry.  Psychiatric: She has a normal mood and affect. Her behavior is normal.    Vital Signs: BP (!) 147/95 (BP Location: Left Arm)   Pulse (!) 110   Temp 97.8 F (36.6 C) (Oral)   Resp 18   Ht _0  (1.575 m)   Wt 65.2 kg   SpO2 93%    BMI 26.30 kg/m  Pain Scale: 0-10   Pain Score: 0-No pain   SpO2: SpO2: 93 % O2 Device:SpO2: 93 % O2 Flow Rate: .O2 Flow Rate (L/min): 5 L/min  IO: Intake/output summary:   Intake/Output Summary (Last 24 hours) at 12/11/2017 1312 Last data filed at 12/11/2017 1042 Gross per 24 hour  Intake 370 ml  Output 750 ml  Net -380 ml    LBM: Last BM Date: 12/09/17 Baseline Weight: Weight: 65.5 kg Most recent weight: Weight: 65.2 kg     Palliative Assessment/Data: PPS 30%     Time In: 1200 Time Out: 1400 Time Total: 120 minutes Greater than 50%  of this time was spent counseling and coordinating care related to the above assessment and plan.  Juel Burrow, DNP, AGNP-C Palliative Medicine Team [phone] Pager: [phone]

## 2017-12-11 NOTE — Clinical Social Work Note (Addendum)
CSW received consult for patient needing palliative to follow patient at [facility].  CSW spoke to [facility], they will accept patient and will try to complete some rehab with her.  Per [facility]  Patient's family would like to try rehab first and then possibly transition to either long term care with hospice to follow or ALF placement.  CSW continuing to follow patient's progress throughout discharge planning.  CSW contacted [place] and [facility] nurse liaison and she is aware of referral.   Jones Broom. [place], MSW, [place]  12/11/2017 3:55 PM

## 2017-12-12 ENCOUNTER — Ambulatory Visit: Payer: Medicare Other

## 2017-12-12 ENCOUNTER — Encounter: Payer: Self-pay | Admitting: Interventional Radiology

## 2017-12-12 ENCOUNTER — Other Ambulatory Visit: Payer: Medicare Other

## 2017-12-12 DIAGNOSIS — Z515 Encounter for palliative care: Secondary | ICD-10-CM

## 2017-12-12 DIAGNOSIS — J910 Malignant pleural effusion: Secondary | ICD-10-CM

## 2017-12-12 DIAGNOSIS — R0602 Shortness of breath: Secondary | ICD-10-CM

## 2017-12-12 DIAGNOSIS — Z7189 Other specified counseling: Secondary | ICD-10-CM

## 2017-12-12 HISTORY — PX: IR PERC PLEURAL DRAIN W/INDWELL CATH W/IMG GUIDE: IMG5383

## 2017-12-12 MED ORDER — IOPAMIDOL (ISOVUE-300) INJECTION 61%: 30.0000 mL | Freq: Once | INTRAVENOUS | Status: DC | PRN

## 2017-12-12 MED ORDER — FENTANYL CITRATE (PF) 100 MCG/2ML IJ SOLN
INTRAMUSCULAR | Status: AC
  Filled 2017-12-12: qty 2

## 2017-12-12 MED ORDER — LIDOCAINE HCL (PF) 1 % IJ SOLN
INTRAMUSCULAR | Status: AC
  Filled 2017-12-12: qty 30

## 2017-12-12 MED ORDER — FENTANYL CITRATE (PF) 100 MCG/2ML IJ SOLN
INTRAMUSCULAR | Status: AC | PRN
  Administered 2017-12-12 (×2): 25 ug via INTRAVENOUS

## 2017-12-12 MED ORDER — MIDAZOLAM HCL 2 MG/2ML IJ SOLN
INTRAMUSCULAR | Status: AC
  Filled 2017-12-12: qty 2

## 2017-12-12 MED ORDER — MIDAZOLAM HCL 2 MG/2ML IJ SOLN
INTRAMUSCULAR | Status: AC | PRN
  Administered 2017-12-12 (×2): 0.5 mg via INTRAVENOUS

## 2017-12-12 MED ORDER — LIDOCAINE HCL (PF) 1 % IJ SOLN
INTRAMUSCULAR | Status: AC | PRN
  Administered 2017-12-12: 8 mL

## 2017-12-12 MED ORDER — CEFAZOLIN SODIUM-DEXTROSE 2-4 GM/100ML-% IV SOLN
2.0000 g | Freq: Once | INTRAVENOUS | Status: AC
  Administered 2017-12-12: 2 g via INTRAVENOUS
  Filled 2017-12-12: qty 100

## 2017-12-12 NOTE — Evaluation (Signed)
Physical Therapy Evaluation Patient Details Name: Kathryn [name] MRN: [id number] DOB: Oct 13, 1918 Today's Date: 12/12/2017   History of Present Illness  84 y.o. female with a known history of atrial fibrillation, history of breast cancer, glaucoma, hyperlipidemia and hypertension who is on chronic Coumadin therapy presenting with acute onset of shortness of breath and heart beating fast. Admitted with acute respiratory failure  Clinical Impression  Pt is pleasant and willing to participate t/o PT exam but struggled with mobility and overall strength, etc.  She essentially was unable to get to standing w/o very heavy assist and could not manage to attain upright posture or take a meaningful step in attempt to get to the recliner. It appears that recently she was able to ambulate with walker relatively well and be much more independent, at this point pt is far from her baseline and will need STR.    Follow Up Recommendations SNF    Equipment Recommendations  None recommended by PT    Recommendations for Other Services       Precautions / Restrictions Precautions Precautions: Fall Restrictions Weight Bearing Restrictions: No      Mobility  Bed Mobility Overal bed mobility: Needs Assistance Bed Mobility: Supine to Sit     Supine to sit: Mod assist     General bed mobility comments: Pt showed some effort with getting to sitting, but did ultimately need considerable assist to get upright  Transfers Overall transfer level: Needs assistance Equipment used: Rolling walker (2 wheeled) Transfers: Sit to/from Stand Sit to Stand: Max assist         General transfer comment: Pt indicated that she thought she would be able to help more, on both attempts she needed heavy assist and ultimately could not attain/maintain upright posture even once up  Ambulation/Gait             General Gait Details: Pt unable to take meaningful steps, need max assist to pivot to the recliner with no  foot clearance and heavily labored and shuffling of feet to the side  Stairs            Wheelchair Mobility    Modified Rankin (Stroke Patients Only)       Balance Overall balance assessment: Needs assistance   Sitting balance-Leahy Scale: Fair       Standing balance-Leahy Scale: Zero Standing balance comment: Pt unable to keep hips forward over BOS, heavy PT assist to stay upright                             Pertinent Vitals/Pain      Home Living Family/patient expects to be discharged to:: Skilled nursing facility                      Prior Function Level of Independence: Independent with assistive device(s)         Comments: Pt apparelty from independent living facility needing only minimal assist      Hand Dominance        Extremity/Trunk Assessment   Upper Extremity Assessment Upper Extremity Assessment: Generalized weakness(age appropriate limitations)    Lower Extremity Assessment Lower Extremity Assessment: Generalized weakness(age appropriate limitations)       Communication   Communication: No difficulties  Cognition Arousal/Alertness: Awake/alert Behavior During Therapy: WFL for tasks assessed/performed Overall Cognitive Status: (pleasant t/o the session, no overt confusion, at time slow)  General Comments      Exercises General Exercises - Lower Extremity Ankle Circles/Pumps: AROM;10 reps Long Arc Quad: AAROM;10 reps Heel Slides: AROM;AAROM;10 reps Hip ABduction/ADduction: AROM;10 reps;AAROM Hip Flexion/Marching: AROM;AAROM;10 reps   Assessment/Plan    PT Assessment Patient needs continued PT services  PT Problem List Decreased strength;Decreased range of motion;Decreased activity tolerance;Decreased balance;Decreased mobility;Decreased coordination;Decreased cognition;Decreased knowledge of use of DME;Decreased safety awareness;Cardiopulmonary status  limiting activity       PT Treatment Interventions DME instruction;Gait training;Functional mobility training;Therapeutic activities;Balance training;Therapeutic exercise;Neuromuscular re-education;Patient/family education    PT Goals (Current goals can be found in the Care Plan section)  Acute Rehab PT Goals Patient Stated Goal: get back to walking PT Goal Formulation: With patient Time For Goal Achievement: 12/26/17 Potential to Achieve Goals: Fair    Frequency Min 2X/week   Barriers to discharge        Co-evaluation               AM-PAC PT "6 Clicks" Daily Activity  Outcome Measure Difficulty turning over in bed (including adjusting bedclothes, sheets and blankets)?: Unable Difficulty moving from lying on back to sitting on the side of the bed? : Unable Difficulty sitting down on and standing up from a chair with arms (e.g., wheelchair, bedside commode, etc,.)?: Unable Help needed moving to and from a bed to chair (including a wheelchair)?: Total Help needed walking in hospital room?: Total Help needed climbing 3-5 steps with a railing? : Total 6 Click Score: 6    End of Session Equipment Utilized During Treatment: Gait belt Activity Tolerance: Patient tolerated treatment well Patient left: with chair alarm set;with call bell/phone within reach Nurse Communication: Mobility status(+2 assist) PT Visit Diagnosis: Muscle weakness (generalized) (M62.81);Difficulty in walking, not elsewhere classified (R26.2)    Time: 0828-0900 PT Time Calculation (min) (ACUTE ONLY): 32 min   Charges:   PT Evaluation $PT Eval Low Complexity: 1 Low PT Treatments $Therapeutic Exercise: 8-22 mins        Kreg Shropshire, DPT 12/12/2017, 11:20 AM

## 2017-12-12 NOTE — Progress Notes (Signed)
[facility] NAME: Kathryn [name]    MR#:  [id number]  DATE OF BIRTH:  Oct 21, 1918  SUBJECTIVE:    Patient has no complaints, on oxygen by nasal cannula 3 L today. REVIEW OF SYSTEMS:    Review of Systems  Constitutional: Positive for malaise/fatigue. Negative for chills and fever.  HENT: Negative for congestion and tinnitus.   Eyes: Negative for blurred vision and double vision.  Respiratory: Negative for cough, shortness of breath and wheezing.   Cardiovascular: Negative for chest pain, orthopnea and PND.  Gastrointestinal: Negative for abdominal pain, diarrhea, nausea and vomiting.  Genitourinary: Negative for dysuria and hematuria.  Skin: Negative for rash.  Neurological: Positive for weakness (Generalized). Negative for dizziness, sensory change and focal weakness.  Psychiatric/Behavioral: Negative for depression. The patient is not nervous/anxious.   All other systems reviewed and are negative.   Nutrition: Regular Tolerating Diet: yes Tolerating PT: Await Eval.   DRUG ALLERGIES:  No Known Allergies  VITALS:  Blood pressure (!) 153/79, pulse (!) 103, temperature 98.3 F (36.8 C), temperature source Oral, resp. rate (!) 22, height 5\' 2"  (1.575 m), weight 65.2 kg, SpO2 92 %.  PHYSICAL EXAMINATION:   Physical Exam  GENERAL:  84 y.o.-year-old patient lying in bed lethargic and in mild Resp. Distress. EYES: Pupils equal, round, reactive to light and accommodation. No scleral icterus. Extraocular muscles intact.  HEENT: Head atraumatic, normocephalic. Oropharynx and nasopharynx clear.  NECK:  Supple, no jugular venous distention. No thyroid enlargement, no tenderness.  LUNGS: Poor a/E on left mid-to lower lung fields, no wheezing, rales, rhonchi. no use of accessory muscles of respiration.  CARDIOVASCULAR: S1, S2 normal. No murmurs, rubs, or gallops.  ABDOMEN: Soft, nontender, nondistended. Bowel sounds present. No  organomegaly or mass.  EXTREMITIES: No cyanosis, clubbing or edema b/l.    NEUROLOGIC: Cranial nerves II through XII are intact. No focal Motor or sensory deficits b/l.  Globally weak.  PSYCHIATRIC: The patient is alert and oriented x 3.  SKIN: No obvious rash, lesion, or ulcer.    LABORATORY PANEL:   CBC Recent Labs  Lab 12/11/17 1823  WBC 13.1*  HGB 13.9  HCT 41.0  PLT 194   ------------------------------------------------------------------------------------------------------------------  Chemistries  Recent Labs  Lab 12/07/17 1036 12/08/17 0451  NA 137 137  K 4.1 3.5  CL 100 102  CO2 30 29  GLUCOSE 125* 101*  BUN 15 15  CREATININE 0.79 0.63  CALCIUM 9.4 8.4*  AST 36  --   ALT 30  --   ALKPHOS 23*  --   BILITOT 0.9  --    ------------------------------------------------------------------------------------------------------------------  Cardiac Enzymes Recent Labs  Lab 12/07/17 1036  TROPONINI <0.03   ------------------------------------------------------------------------------------------------------------------  RADIOLOGY:  No results found.   ASSESSMENT AND PLAN:   84 year old female with past medical history of essential hypertension, atrial fibrillation, hyperlipidemia, glaucoma, history of breast cancer who presents to the hospital due to shortness of breath and noted to have a large left-sided pleural effusion.  1.  Acute respiratory failure with hypoxia-secondary to the pleural effusion as mentioned. - Status post thoracentesis this past weekend with 1 L of blood-tinged fluid removed.  Continue O2 supplementation.  2.  Pleural effusion- source of patient's worsening respiratory failure.  This is an exudative effusion. Per pathology report, patient's initial cytology is positive for malignant cells.  Further testing/immunohistochemistry testing still pending. - Given the fact that the patient's respiratory failure and pleural effusions with  malignant  she would benefit from palliative drainage and therefore plan for IR guided Pleurx catheter placement today to relieve respiratory distress.  3.  Urinary tract infection-based off a urinalysis.  Patient is clinically asymptomatic. Completed oral Ceftin for 3 days.  4.  History of chronic atrial fibrillation-rate controlled.  Continue digoxin, Cardizem. -Patient was on Coumadin but given the blood-tinged pleural effusion, discontinued Coumadin indefinitely as per cardiology.  5.  Essential hypertension-continue lisinopril, hydrochlorothiazide, Cardizem.  6.  Glaucoma-continue latanoprost, Timoptic eyedrops.  7. Coagulapathy -to Coumadin, given vitamin K to reverse coagulopathy and plan for Pleurx catheter placement today.  Per palliative Care staff, patient and family interested in hospice care; however they want patient to receive short term rehab so plan for patient to go to rehab with palliative follow-up and transition to hospice as appropriate.  All the records are reviewed and case discussed with Care Management/Social Worker. Management plans discussed with the patient, family and they are in agreement.  CODE STATUS: DNR  DVT Prophylaxis: Coumadin  TOTAL TIME TAKING CARE OF THIS PATIENT: 33 minutes.   POSSIBLE D/C IN 1-2 DAYS, DEPENDING ON CLINICAL CONDITION.   Demetrios Loll M.D on 12/12/2017 at 2:00 PM  Between 7am to 6pm - Pager - [phone]  After 6pm go to [email or web address] - [employer] [facility]  Office  [phone]  CC: Primary care physician; Ezequiel Kayser, MD

## 2017-12-12 NOTE — Plan of Care (Signed)
  Problem: Pain Managment: Goal: General experience of comfort will improve Outcome: Progressing   Problem: Safety: Goal: Ability to remain free from injury will improve Outcome: Progressing   

## 2017-12-12 NOTE — Care Management Important Message (Signed)
Copy of signed IM left with patient in room.  

## 2017-12-12 NOTE — Progress Notes (Signed)
Daily Progress Note   Patient Name: Kathryn [name]       Date: 12/12/2017 DOB: [birth year]  Age: 84 y.o. MRN#: [id number] Attending Physician: Demetrios Loll, MD Primary Care Physician: Ezequiel Kayser, MD Admit Date: 12/07/2017  Reason for Consultation/Follow-up: Establishing goals of care  Subjective: Pt out of bed in chair, sleeping, appears comfortable.   Length of Stay: 5  Current Medications: Scheduled Meds:  . calcium carbonate  1,500 mg Oral Daily  . digoxin  0.0625 mg Oral Daily  . diltiazem  30 mg Oral Q6H  . ferrous sulfate  325 mg Oral BID WC  . lisinopril  20 mg Oral Daily   And  . hydrochlorothiazide  12.5 mg Oral Daily  . latanoprost  1 drop Both Eyes QHS  . mouth rinse  15 mL Mouth Rinse BID  . multivitamin with minerals  1 tablet Oral Daily  . potassium chloride SA  20 mEq Oral Daily  . sodium chloride flush  3 mL Intravenous Q12H  . timolol  1 drop Both Eyes Daily    Continuous Infusions: . sodium chloride    . sodium chloride      PRN Meds: sodium chloride, acetaminophen **OR** [DISCONTINUED] acetaminophen, [DISCONTINUED] ondansetron **OR** ondansetron (ZOFRAN) IV, sodium chloride flush, traMADol  Physical Exam         Constitutional: She is oriented to person, place, and time. She appears well-nourished. She appears ill. No distress.  HENT:  Head: Normocephalic and atraumatic.  Cardiovascular: Normal rate.  Pulmonary/Chest: Effort normal and breath sounds normal. No respiratory distress.  Abdominal: Soft.  Musculoskeletal:       Right lower leg: Normal.       Left lower leg: Normal.  Neurological: She is alert and oriented to person, place, and time.  Skin: Skin is warm and dry.  Psychiatric: She has a normal mood and affect. Her behavior is normal.   Vital Signs: BP  (!) 153/79 (BP Location: Left Arm)   Pulse (!) 103   Temp 98.3 F (36.8 C) (Oral)   Resp (!) 22   Ht 5\' 2"  (1.575 m)   Wt 65.2 kg   SpO2 92%   BMI 26.30 kg/m  SpO2: SpO2: 92 % O2 Device: O2 Device: Nasal Cannula O2 Flow Rate: O2 Flow Rate (L/min): 3 L/min  Intake/output summary:   Intake/Output Summary (Last 24 hours) at 12/12/2017 1130 Last data filed at 12/12/2017 8676 Gross per 24 hour  Intake 103.66 ml  Output 150 ml  Net -46.34 ml   LBM: Last BM Date: 12/09/17 Baseline Weight: Weight: 65.5 kg Most recent weight: Weight: 65.2 kg       Palliative Assessment/Data: PPS 40%    Flowsheet Rows     Most Recent Value  Intake Tab  Referral Department  Hospitalist  Unit at Time of Referral  Cardiac/Telemetry Unit  Palliative Care Primary Diagnosis  Cardiac  Date Notified  12/10/17  Palliative Care Type  New Palliative care  Reason for referral  Clarify Goals of Care, Counsel Regarding Hospice  Date of Admission  12/07/17  Date first seen by Palliative Care  12/11/17  # of days Palliative referral response time  1 Day(s)  #  of days IP prior to Palliative referral  3  Clinical Assessment  Palliative Performance Scale Score  30%  Psychosocial & Spiritual Assessment  Palliative Care Outcomes  Patient/Family meeting held?  Yes  Who was at the meeting?  4 children  Palliative Care Outcomes  Clarified goals of care, Provided end of life care assistance, Provided psychosocial or spiritual support, Counseled regarding hospice, Changed to focus on comfort, Linked to palliative care logitudinal support      Patient Active Problem List   Diagnosis Date Noted  . Malignant pleural effusion   . Shortness of breath   . Goals of care, counseling/discussion   . Palliative care by specialist   . Acute respiratory failure ([place]) 12/07/2017  . Hypoxia 10/18/2016    Palliative Care Assessment & Plan   HPI: 84 y.o. female  with past medical history of HTN, a fib, HLD, glaucoma,  and breast cancer admitted on 12/07/2017 with shortness of breath and a fib. Pt found to have large left pleural effusion. S/p thoracentesis with 1 L of blood-tinged fluid removed. Fluid positive for malignant cells. Planning for Pleurx catheter placement prior to discharge. PMT consulted for [place] and hospice evaluation.   Assessment: Patient denies symptoms - happy to be out of bed in chair. Pleurx catheter to be placed today at 2pm. Family updated. PT recommending SNF. Patient and family agreeable with plan for rehab with palliative and transition to hospice as appropriate. Questions addressed. Family encouraged to call with questions.   Recommendations/Plan: - patient and family interested in hospice care; however they want patient to receive short term rehab so plan for patient to go to rehab with palliative follow-up and transition to hospice as appropriate - agree to palliative pleurx placement - no other aggressive medical interventions - DNR  Goals of Care and Additional Recommendations:  Limitations on Scope of Treatment: Avoid Hospitalization, No Chemotherapy and No Radiation  Code Status:  DNR  Prognosis:   Unable to determine  Discharge Planning:  [place] for rehab with Palliative care service follow-up  Care plan was discussed with patient, RN, patient's son and DIL  Thank you for allowing the Palliative Medicine Team to assist in the care of this patient.   Total Time 15 minutes Prolonged Time Billed  no       Greater than 50%  of this time was spent counseling and coordinating care related to the above assessment and plan.  Juel Burrow, DNP, [facility] Palliative Medicine Team Team Phone # [phone]  Pager [phone]

## 2017-12-12 NOTE — Progress Notes (Signed)
Pt returns from plurex catheter placement, VSS, no complaints of pain. Dressing to left upper quad of abdomen is clean, dry and intact. I will continue to assess.

## 2017-12-13 NOTE — Discharge Summary (Signed)
[place] [place] NAME: Kathryn [name]    MR#:  [id number]  DATE OF BIRTH:  Aug 20, 1918  DATE OF ADMISSION:  12/07/2017   ADMITTING PHYSICIAN: Dustin Flock, MD  DATE OF DISCHARGE: 12/13/2017  PRIMARY CARE PHYSICIAN: Ezequiel Kayser, MD   ADMISSION DIAGNOSIS:  SOB (shortness of breath) [R06.02] Pleural effusion [J90] Hypoxia [R09.02] DISCHARGE DIAGNOSIS:  Active Problems:   Acute respiratory failure (HCC)   Malignant pleural effusion   Shortness of breath   Goals of care, counseling/discussion   Palliative care by specialist  SECONDARY DIAGNOSIS:   Past Medical History:  Diagnosis Date  . A-fib ([place])   . Breast cancer ([place]) 04/20/2001   lt mastecomy  . Glaucoma   . Hyperlipidemia   . Hypertension    HOSPITAL COURSE:  84 year old female with past medical history of essential hypertension, atrial fibrillation, hyperlipidemia, glaucoma, history of breast cancer who presents to the hospital due to shortness of breath and noted to have a large left-sided pleural effusion.  1.  Acute respiratory failure with hypoxia-secondary to the pleural effusion as mentioned. - Status post thoracentesis this past weekend with 1 L of blood-tinged fluid removed.  Continue O2 supplementation.  2.  Pleural effusion- source of patient's worsening respiratory failure.  This is an exudative effusion. Per pathology report, patient's initial cytology is positive for malignant cells.  Further testing/immunohistochemistry testing still pending. - Given the fact that the patient's respiratory failure and pleural effusions with malignant she would benefit from palliative drainage. S/P IR guided Pleurx catheter placement with 1 L fluid draw.  3.  Urinary tract infection-based off a urinalysis.  Patient is clinically asymptomatic. Completed oral Ceftin for 3 days.  4.  History of chronic atrial fibrillation-rate controlled.  Continue digoxin,  Cardizem. -Patient was on Coumadin but given the blood-tinged pleural effusion, discontinued Coumadin indefinitely as per cardiology.  5.  Essential hypertension-continue lisinopril, hydrochlorothiazide, Cardizem.  6.  Glaucoma-continue latanoprost, Timoptic eyedrops.  7. Coagulapathy -to Coumadin, given vitamin K to reverse coagulopathy and got Pleurx catheter placement.  Per palliative Care staff, patient and family interested in hospice care; however they want patient to receive short term rehab so plan for patient to go to rehab with palliative follow-up and transition to hospice as appropriate.  DISCHARGE CONDITIONS:  Guarded, discharged to skilled nursing facility with palliative care. CONSULTS OBTAINED:   DRUG ALLERGIES:  No Known Allergies DISCHARGE MEDICATIONS:   Allergies as of 12/13/2017   No Known Allergies     Medication List    STOP taking these medications   alendronate 70 MG tablet Commonly known as:  FOSAMAX   simvastatin 10 MG tablet Commonly known as:  ZOCOR   warfarin 2.5 MG tablet Commonly known as:  COUMADIN     TAKE these medications   CALCIUM 600 1500 (600 Ca) MG Tabs tablet Generic drug:  calcium carbonate Take 1 tablet by mouth daily.   digoxin 0.125 MG tablet Commonly known as:  LANOXIN Take 0.125 mg by mouth daily.   diltiazem 300 MG 24 hr capsule Commonly known as:  CARDIZEM CD Take 300 mg by mouth daily.   ferrous sulfate 325 (65 FE) MG tablet Take 325 mg by mouth 2 (two) times daily.   latanoprost 0.005 % ophthalmic solution Commonly known as:  XALATAN Place 1 drop into both eyes at bedtime.   lisinopril-hydrochlorothiazide 20-12.5 MG tablet Commonly known as:  PRINZIDE,ZESTORETIC Take 1 tablet by mouth daily.   MULTI-VITAMINS  Tabs Take 1 tablet by mouth daily.   potassium chloride SA 20 MEQ tablet Commonly known as:  K-DUR,KLOR-CON Take 20 mEq by mouth daily.   PRESERVISION AREDS PO Take 1 capsule by mouth 2 (two)  times daily.   timolol 0.5 % ophthalmic solution Commonly known as:  TIMOPTIC Place 1 drop into both eyes daily.        DISCHARGE INSTRUCTIONS:  See AVS. If you experience worsening of your admission symptoms, develop shortness of breath, life threatening emergency, suicidal or homicidal thoughts you must seek medical attention immediately by calling 911 or calling your MD immediately  if symptoms less severe.  You Must read complete instructions/literature along with all the possible adverse reactions/side effects for all the Medicines you take and that have been prescribed to you. Take any new Medicines after you have completely understood and accpet all the possible adverse reactions/side effects.   Please note  You were cared for by a hospitalist during your hospital stay. If you have any questions about your discharge medications or the care you received while you were in the hospital after you are discharged, you can call the unit and asked to speak with the hospitalist on call if the hospitalist that took care of you is not available. Once you are discharged, your primary care physician will handle any further medical issues. Please note that NO REFILLS for any discharge medications will be authorized once you are discharged, as it is imperative that you return to your primary care physician (or establish a relationship with a primary care physician if you do not have one) for your aftercare needs so that they can reassess your need for medications and monitor your lab values.    On the day of Discharge:  VITAL SIGNS:  Blood pressure (!) 150/64, pulse 99, temperature 98.1 F (36.7 C), temperature source Oral, resp. rate 18, height 5\' 2"  (1.575 m), weight 65.2 kg, SpO2 94 %. PHYSICAL EXAMINATION:  GENERAL:  84 y.o.-year-old patient lying in the bed with no acute distress.  EYES: Pupils equal, round, reactive to light and accommodation. No scleral icterus. Extraocular muscles intact.   HEENT: Head atraumatic, normocephalic. NECK:  Supple, no jugular venous distention. No thyroid enlargement, no tenderness.  LUNGS: Normal breath sounds bilaterally, no wheezing, rales,rhonchi or crepitation. No use of accessory muscles of respiration.  CARDIOVASCULAR: S1, S2 normal. No murmurs, rubs, or gallops.  ABDOMEN: Soft, non-tender, non-distended. Bowel sounds present. No organomegaly or mass.  EXTREMITIES: No pedal edema, cyanosis, or clubbing.  NEUROLOGIC: Cranial nerves II through XII are intact. Muscle strength 4/5 in all extremities. Sensation intact. Gait not checked.  PSYCHIATRIC: The patient is alert and oriented x 3.  SKIN: No obvious rash, lesion, or ulcer.  DATA REVIEW:   CBC Recent Labs  Lab 12/11/17 1823  WBC 13.1*  HGB 13.9  HCT 41.0  PLT 194    Chemistries  Recent Labs  Lab 12/07/17 1036 12/08/17 0451  NA 137 137  K 4.1 3.5  CL 100 102  CO2 30 29  GLUCOSE 125* 101*  BUN 15 15  CREATININE 0.79 0.63  CALCIUM 9.4 8.4*  AST 36  --   ALT 30  --   ALKPHOS 23*  --   BILITOT 0.9  --      Microbiology Results  Results for orders placed or performed during the hospital encounter of 12/07/17  MRSA PCR Screening     Status: None   Collection Time: 12/07/17  1:09 PM  Result Value Ref Range Status   MRSA by PCR NEGATIVE NEGATIVE Final    Comment:        The GeneXpert MRSA Assay (FDA approved for NASAL specimens only), is one component of a comprehensive MRSA colonization surveillance program. It is not intended to diagnose MRSA infection nor to guide or monitor treatment for MRSA infections. Performed at [facility], [address] [place], [place] [address]   Body fluid culture     Status: None   Collection Time: 12/07/17  2:30 PM  Result Value Ref Range Status   Specimen Description   Final    PLEURAL Performed at [facility], [address] [place], [place] [address]    Special Requests   Final    NONE Performed  at [facility] [place], [place] [address]    Gram Stain   Final    FEW WBC PRESENT,BOTH PMN AND MONONUCLEAR NO ORGANISMS SEEN    Culture   Final    NO GROWTH 3 DAYS Performed at [facility], [place] [address] [place], [place] [address]    Report Status 12/11/2017 FINAL  Final    RADIOLOGY:  Ir Perc Pleural Drain Genia Harold Cath W/img Guide  Result Date: 12/12/2017 CLINICAL DATA:  Recurrent malignant left pleural effusion. Tunneled PleurX drainage catheter has been requested for palliative purposes. EXAM: INSERTION OF TUNNELED PLEURAL DRAINAGE CATHETER ANESTHESIA/SEDATION: 1.0 mg IV Versed; 50 mcg IV Fentanyl. Total Moderate Sedation Time 16 minutes. The patient's level of consciousness and physiologic status were continuously monitored during the procedure by Radiology nursing. MEDICATIONS: 2 g IV Ancef. Antibiotic was administered in an appropriate time interval for the procedure. FLUOROSCOPY TIME:  6 seconds.  4.0 mGy. PROCEDURE: The procedure, risks, benefits, and alternatives were explained to the patient and her sons. Questions regarding the procedure were encouraged and answered. Written informed consent was obtained from the patient's son. The left chest wall was prepped with chlorhexidine in a sterile fashion, and a sterile drape was applied covering the operative field. A sterile gown and sterile gloves were used for the procedure. Local anesthesia was provided with 1% Lidocaine. Ultrasound image documentation was performed. Fluoroscopy during the procedure and fluoroscopic spot radiograph confirms appropriate catheter position. After creating a small skin incision, a 19 gauge needle was advanced into the pleural cavity under ultrasound guidance. A guide wire was then advanced under fluoroscopy into the pleural space. Pleural access was dilated serially and a 16-French peel-away sheath placed. A 15.5 French PleurX catheter was placed. This was tunneled from an  incision 5 cm below the pleural access to the access site. The catheter was advanced through the peel-away sheath. The sheath was then removed. Final catheter positioning was confirmed with a fluoroscopic spot image. The access incision was closed with subcutaneous 4-0 Vicryl. Dermabond was applied to the incision. A Prolene retention suture was applied at the catheter exit site. Large volume thoracentesis was performed through the new catheter utilizing gravity drainage bag. COMPLICATIONS: None. FINDINGS: The catheter was placed via the left lateral chest wall. Catheter course is towards the apex. Approximately 1 liter of bloody pleural fluid was able to be removed after catheter placement. IMPRESSION: Placement of permanent, tunneled left pleural drainage catheter via lateral approach. One liter of pleural fluid was removed today after catheter placement. Electronically Signed   By: Aletta Edouard M.D.   On: 12/12/2017 15:35     Management plans discussed with the patient, family and  they are in agreement.  CODE STATUS: DNR   TOTAL TIME TAKING CARE OF THIS PATIENT: 33 minutes.    Demetrios Loll M.D on 12/13/2017 at 12:09 PM  Between 7am to 6pm - Pager - [phone]  After 6pm go to [email or web address] - Technical [employer] [place] [facility]  Office  [phone]  CC: Primary care physician; Ezequiel Kayser, MD   Note: This dictation was prepared with Dragon dictation along with smaller phrase technology. Any transcriptional errors that result from this process are unintentional.

## 2017-12-13 NOTE — Progress Notes (Signed)
Patient left via EMS to go to [facility]. RN removed IV.  Patient left with her chest tube in place.  Phillis Knack, RN

## 2017-12-13 NOTE — Discharge Instructions (Signed)
Palliative care.

## 2017-12-13 NOTE — Clinical Social Work Note (Addendum)
Patient to be d/c'ed today to [facility] room E2.  Patient and family agreeable to plans will transport via ems RN to call report [phone].  Patient will have palliative follow while at SNF, and patient's son Patrick Jupiter is at bedside and aware that patient will be discharging today.  Evette Cristal, MSW, [place]

## 2017-12-13 NOTE — Clinical Social Work Placement (Addendum)
   CLINICAL SOCIAL WORK PLACEMENT  NOTE  Date:  12/13/2017  Patient Details  Name: Kathryn [name] MRN: [id number] Date of Birth: 08/13/1918  Clinical Social Work is seeking post-discharge placement for this patient at the [place] level of care (*CSW will initial, date and re-position this form in  chart as items are completed):  Yes   Patient/family provided with [place] Work Department's list of facilities offering this level of care within the geographic area requested by the patient (or if unable, by the patient's family).  Yes   Patient/family informed of their freedom to choose among providers that offer the needed level of care, that participate in Medicare, Medicaid or managed care program needed by the patient, have an available bed and are willing to accept the patient.  Yes   Patient/family informed of [place]'s ownership interest in [facility] and [facility], as well as of the fact that they are under no obligation to receive care at these facilities.  PASRR submitted to EDS on 12/11/17     PASRR number received on 12/11/17     Existing PASRR number confirmed on       FL2 transmitted to all facilities in geographic area requested by pt/family on 12/11/17     FL2 transmitted to all facilities within larger geographic area on       Patient informed that his/her managed care company has contracts with or will negotiate with certain facilities, including the following:        Yes   Patient/family informed of bed offers received.  Patient chooses bed at [facility]     Physician recommends and patient chooses bed at      Patient to be transferred to [facility] on 12/13/17.  Patient to be transferred to facility by [facility]     Patient family notified on 12/13/17 of transfer.  Name of family member notified:  Son Patrick Jupiter at bedside and are aware that patient will be  discharging today.     PHYSICIAN Please sign FL2, Please sign DNR     Additional Comment:    _______________________________________________ Ross Ludwig, LCSWA 12/13/2017, 1:23 PM

## 2017-12-17 ENCOUNTER — Telehealth: Payer: Self-pay

## 2017-12-17 NOTE — Telephone Encounter (Signed)
Palliative Medicine RN Note: Rec'd call from pt's daughter; no one is providing care for the PleurX, and she is confused about hospice care at [facility].   We discussed her decision for rehab with palliative and that because [facility] is paying for rehab, they will not also pay for hospice. Tube care becomes then responsibility of the facility. She will talk to SNF RN.  Later rec'd call from Romelle Starcher, RN at the facility [phone]. 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 She states she did not get information on tube care at d/c and that she was told in report that "someone would be coming to take care of it every week." I spoke with SW Randall Hiss, who coordinated d/c on Friday; SNF was aware of tube and the referral of palliative care. Unclear why SNF has not gotten clarification of orders in the four days since she returned.  I spoke again with Tammy. Per Randall Hiss, I directed her to the patient's attending physician at the facility.  Marjie Skiff Asja Frommer, RN, BSN, [facility] Palliative Medicine Team 12/17/2017 10:57 AM Office [phone]

## 2018-01-19 ENCOUNTER — Inpatient Hospital Stay: Payer: Medicare Other

## 2018-01-19 ENCOUNTER — Other Ambulatory Visit: Payer: Self-pay

## 2018-01-19 ENCOUNTER — Inpatient Hospital Stay
Admission: EM | Admit: 2018-01-19 | Discharge: 2018-01-20 | DRG: 189 | Disposition: A | Discharge status: Hospice | Payer: Medicare Other | Attending: Internal Medicine | Admitting: Internal Medicine

## 2018-01-19 ENCOUNTER — Emergency Department: Payer: Medicare Other

## 2018-01-19 DIAGNOSIS — Z90710 Acquired absence of both cervix and uterus: Secondary | ICD-10-CM

## 2018-01-19 DIAGNOSIS — J910 Malignant pleural effusion: Secondary | ICD-10-CM | POA: Diagnosis present

## 2018-01-19 DIAGNOSIS — J90 Pleural effusion, not elsewhere classified: Secondary | ICD-10-CM

## 2018-01-19 DIAGNOSIS — Z853 Personal history of malignant neoplasm of breast: Secondary | ICD-10-CM

## 2018-01-19 DIAGNOSIS — Z79899 Other long term (current) drug therapy: Secondary | ICD-10-CM

## 2018-01-19 DIAGNOSIS — R0682 Tachypnea, not elsewhere classified: Secondary | ICD-10-CM | POA: Diagnosis present

## 2018-01-19 DIAGNOSIS — Z66 Do not resuscitate: Secondary | ICD-10-CM | POA: Diagnosis present

## 2018-01-19 DIAGNOSIS — F419 Anxiety disorder, unspecified: Secondary | ICD-10-CM | POA: Diagnosis present

## 2018-01-19 DIAGNOSIS — H409 Unspecified glaucoma: Secondary | ICD-10-CM | POA: Diagnosis present

## 2018-01-19 DIAGNOSIS — J811 Chronic pulmonary edema: Secondary | ICD-10-CM | POA: Diagnosis present

## 2018-01-19 DIAGNOSIS — E785 Hyperlipidemia, unspecified: Secondary | ICD-10-CM | POA: Diagnosis present

## 2018-01-19 DIAGNOSIS — Z515 Encounter for palliative care: Secondary | ICD-10-CM | POA: Diagnosis not present

## 2018-01-19 DIAGNOSIS — Z803 Family history of malignant neoplasm of breast: Secondary | ICD-10-CM

## 2018-01-19 DIAGNOSIS — Z9012 Acquired absence of left breast and nipple: Secondary | ICD-10-CM

## 2018-01-19 DIAGNOSIS — J9601 Acute respiratory failure with hypoxia: Secondary | ICD-10-CM | POA: Diagnosis present

## 2018-01-19 DIAGNOSIS — I119 Hypertensive heart disease without heart failure: Secondary | ICD-10-CM | POA: Diagnosis present

## 2018-01-19 DIAGNOSIS — R0603 Acute respiratory distress: Secondary | ICD-10-CM | POA: Diagnosis not present

## 2018-01-19 DIAGNOSIS — Z7189 Other specified counseling: Secondary | ICD-10-CM | POA: Diagnosis not present

## 2018-01-19 LAB — COMPREHENSIVE METABOLIC PANEL
ALBUMIN: 2.6 g/dL — AB (ref 3.5–5.0)
ALK PHOS: 28 U/L — AB (ref 38–126)
ALT: 43 U/L (ref 0–44)
AST: 33 U/L (ref 15–41)
Anion gap: 10 (ref 5–15)
BILIRUBIN TOTAL: 1 mg/dL (ref 0.3–1.2)
BUN: 22 mg/dL (ref 8–23)
CALCIUM: 9 mg/dL (ref 8.9–10.3)
CO2: 28 mmol/L (ref 22–32)
Chloride: 98 mmol/L (ref 98–111)
Creatinine, Ser: 0.65 mg/dL (ref 0.44–1.00)
GLUCOSE: 126 mg/dL — AB (ref 70–99)
Potassium: 4.4 mmol/L (ref 3.5–5.1)
Sodium: 136 mmol/L (ref 135–145)
TOTAL PROTEIN: 6.5 g/dL (ref 6.5–8.1)

## 2018-01-19 LAB — CBC WITH DIFFERENTIAL/PLATELET
Basophils Absolute: 0.1 10*3/uL (ref 0–0.1)
Basophils Relative: 0 %
EOS ABS: 0.2 10*3/uL (ref 0–0.7)
Eosinophils Relative: 1 %
HCT: 40.1 % (ref 35.0–47.0)
HEMOGLOBIN: 13 g/dL (ref 12.0–16.0)
LYMPHS PCT: 4 %
Lymphs Abs: 0.8 10*3/uL — ABNORMAL LOW (ref 1.0–3.6)
MCH: 31.5 pg (ref 26.0–34.0)
MCHC: 32.3 g/dL (ref 32.0–36.0)
MCV: 97.4 fL (ref 80.0–100.0)
Monocytes Absolute: 1.5 10*3/uL — ABNORMAL HIGH (ref 0.2–0.9)
Monocytes Relative: 7 %
NEUTROS PCT: 88 %
Neutro Abs: 18.8 10*3/uL — ABNORMAL HIGH (ref 1.4–6.5)
Platelets: 393 10*3/uL (ref 150–440)
RBC: 4.12 MIL/uL (ref 3.80–5.20)
RDW: 13.6 % (ref 11.5–14.5)
WBC: 21.4 10*3/uL — AB (ref 3.6–11.0)

## 2018-01-19 LAB — PROTIME-INR
INR: 1.21
Prothrombin Time: 15.2 seconds (ref 11.4–15.2)

## 2018-01-19 LAB — MRSA PCR SCREENING: MRSA by PCR: NEGATIVE

## 2018-01-19 LAB — TROPONIN I: Troponin I: 0.04 ng/mL (ref <0.03)

## 2018-01-19 LAB — LACTIC ACID, PLASMA: Lactic Acid, Venous: 1.1 mmol/L (ref 0.5–1.9)

## 2018-01-19 LAB — LIPASE, BLOOD: Lipase: 21 U/L (ref 11–51)

## 2018-01-19 MED ORDER — ACETAMINOPHEN 325 MG PO TABS: 650.0000 mg | ORAL_TABLET | Freq: Four times a day (QID) | ORAL | Status: DC | PRN

## 2018-01-19 MED ORDER — ONDANSETRON HCL 4 MG PO TABS: 4.0000 mg | ORAL_TABLET | Freq: Four times a day (QID) | ORAL | Status: DC | PRN

## 2018-01-19 MED ORDER — ACETAMINOPHEN 650 MG RE SUPP: 650.0000 mg | Freq: Four times a day (QID) | RECTAL | Status: DC | PRN

## 2018-01-19 MED ORDER — FUROSEMIDE 10 MG/ML IJ SOLN
40.0000 mg | Freq: Every day | INTRAMUSCULAR | Status: DC
  Administered 2018-01-19 – 2018-01-20 (×2): 40 mg via INTRAVENOUS
  Filled 2018-01-19 (×2): qty 4

## 2018-01-19 MED ORDER — MORPHINE SULFATE (CONCENTRATE) 10 MG/0.5ML PO SOLN: 10.0000 mg | ORAL | Status: DC | PRN

## 2018-01-19 MED ORDER — LORAZEPAM 2 MG/ML IJ SOLN: 1.0000 mg | Freq: Four times a day (QID) | INTRAMUSCULAR | Status: DC | PRN

## 2018-01-19 MED ORDER — ONDANSETRON HCL 4 MG/2ML IJ SOLN: 4.0000 mg | Freq: Four times a day (QID) | INTRAMUSCULAR | Status: DC | PRN

## 2018-01-19 MED ORDER — VANCOMYCIN HCL IN DEXTROSE 1-5 GM/200ML-% IV SOLN: 1000.0000 mg | Freq: Once | INTRAVENOUS | Status: DC

## 2018-01-19 MED ORDER — SODIUM CHLORIDE 0.9 % IV SOLN: 2.0000 g | Freq: Two times a day (BID) | INTRAVENOUS | Status: DC

## 2018-01-19 MED ORDER — LATANOPROST 0.005 % OP SOLN
1.0000 [drp] | Freq: Every day | OPHTHALMIC | Status: DC
  Administered 2018-01-19: 1 [drp] via OPHTHALMIC
  Filled 2018-01-19: qty 2.5

## 2018-01-19 MED ORDER — TIMOLOL MALEATE 0.5 % OP SOLN
1.0000 [drp] | Freq: Every day | OPHTHALMIC | Status: DC
  Administered 2018-01-20: 1 [drp] via OPHTHALMIC
  Filled 2018-01-19: qty 5

## 2018-01-19 NOTE — Clinical Social Work Note (Addendum)
Clinical Social Work Assessment  Patient Details  Name: Kathryn [name] MRN: [id number] Date of Birth: 1919-03-30  Date of referral:  01/19/18               Reason for consult:  Other (Comment Required)(From [facility])                Permission sought to share information with:  Facility Sport and exercise psychologist, Family Supports Permission granted to share information::  Yes, Release of Information Signed  Name::     Alvin Critchley Daughter [phone] Rozanne Heumann son [place] son [phone] Milas Kocher daughter [phone]  Agency::  [facility]  Relationship::     Contact Information:     Housing/Transportation Living arrangements for the past 2 months:  [place] of Information:  Medical Team Patient Interpreter Needed:  None Criminal Activity/Legal Involvement Pertinent to Current Situation/Hospitalization:  No - Comment as needed Significant Relationships:  Adult Children, Warehouse manager, Other Family Members Lives with:  Facility Resident Do you feel safe going back to the place where you live?  Yes Need for family participation in patient care:  Yes (Comment)  Care giving concerns: All her children were present when she arrived today/was not able to meet them    Social Worker assessment / plan: LCSW introduced myself to patient, who pleasantly greeted this worker. She is Oriented x1 and stated she is struggling to talk and breathe. No family is now present so questions were answered through medical notes. Patient has medicare /BCBS and 4 children. She is a resident of [facility], the family is very engaged. Patient required some assistance with ADL's.   [name] is a 84 y.o. female presenting with Shortness of Breath/ Family met with Dr Maretta Los  ( These notes taken from Dr Maretta Los) .Lorrine Kin Polendo is a healthcare power of attorney who was present for the conversation. Family understood that this is reoccurring malignant pleural effusion.   They did not want any aggressive measures or procedures including a thoracentesis. -We will admit patient for comfort measures.  The decision was made to continue current code status  CODE STATUS: DNR.   Employment status:  Retired Merchant navy officer) PT Recommendations:  Not assessed at this time Information / Referral to community resources:  Other (Comment Required)(TBD)  Patient/Family's Response to care: Understanding what their Mothers health issues are  Patient/Family's Understanding of and Emotional Response to Diagnosis, Current Treatment, and Prognosis: The family has a good understanding  Emotional Assessment Appearance:  Appears stated age Attitude/Demeanor/Rapport:  Unable to Assess(Struggles to speak- SOB ) Affect (typically observed):  Calm, Unable to Assess Orientation:  Oriented to Self Alcohol / Substance use:  Not Applicable Psych involvement (Current and /or in the community):  No (Comment)  Discharge Needs  Concerns to be addressed:  Care Coordination Readmission within the last 30 days:  No Current discharge risk:  None, Chronically ill Barriers to Discharge:  Continued Medical Work up   Joana Reamer, LCSW 01/19/2018, 4:24 PM

## 2018-01-19 NOTE — ED Notes (Signed)
RT contacted for high flo nasal canula

## 2018-01-19 NOTE — ED Notes (Signed)
Patients O2 turned down to 4L with sats of 97%

## 2018-01-19 NOTE — ED Notes (Signed)
Patient transported to IR 

## 2018-01-19 NOTE — Progress Notes (Signed)
   01/19/18 1935  Clinical Encounter Type  Visited With Patient  Visit Type Initial;Spiritual support (order for prayer)  Referral From Nurse  Consult/Referral To [place] responded to order for prayer.  Chaplain asked patient what she would like to include in her prayer.  Patient stated that she wanted to get better and to go to heaven; chaplain clarified statement and patient agreed that that was what she had said.  Chaplain prayed with patient for God's perfect healing, for patient to reach heaven in God's time and for God's support along her journey.  Patient expressed feeling tired so chaplain exited after encouraging patient to page as needed.

## 2018-01-19 NOTE — Progress Notes (Signed)
   [place] at [place] Hospital Day: 0 days Kathryn [name] is a 84 y.o. female presenting with Shortness of Breath .   Advance care planning discussed with patient's 2 sons and daughter-in-law at bedside.  Her older son Mr. Lorrine Kin Moshier is a healthcare power of attorney who was present for the conversation.  Patient was unable to participate due to her confusion.. All questions in regards to overall condition and expected prognosis answered. -Family understood that this is reoccurring malignant pleural effusion.  They did not want any aggressive measures or procedures including a thoracentesis.  They have requested for patient to be comfortable. -We will admit patient for comfort measures.  The decision was made to continue current code status  CODE STATUS: DNR Time spent: 18 minutes

## 2018-01-19 NOTE — ED Triage Notes (Signed)
Pt arrives via ems from [facility] of [facility]. EMS states called for SOB. Pt seen yesterday and had xray done, provider ordered lasix. Pt has not received today's dose. On arrival pt on 15L O2, still sob, labored breathing, accessory muscle use. Pt currently placed on non rebreather 15L. Pt a&o x 4 on arrival

## 2018-01-19 NOTE — Consult Note (Signed)
Pharmacy Antibiotic Note  Kathryn [name] is a 84 y.o. female admitted on 01/19/2018 with pneumonia.  Pharmacy has been consulted for cefepime and vancomycin dosing.  Plan: Vancomycin 1000 mg IV once followed by 11 hour stacked dosing vancomycin 750 mg IV every 24 hours.  Goal trough 15-20 mcg/mL.  Will draw trough 30 minutes prior to the fifth dose.  Cefepime 2 gm IV every 12 hours  Height: 5\' 2"  (157.5 cm) Weight: 135 lb (61.2 kg) IBW/kg (Calculated) : 50.1  Temp (24hrs), Avg:98.5 F (36.9 C), Min:98.5 F (36.9 C), Max:98.5 F (36.9 C)  Recent Labs  Lab 01/19/18 1255 01/19/18 1401  WBC 21.4*  --   CREATININE  --  0.65  LATICACIDVEN 1.1  --     Estimated Creatinine Clearance: 34.6 mL/min (by C-G formula based on SCr of 0.65 mg/dL).    No Known Allergies  Antimicrobials this admission: cefepime 10/06 >>  vanco 10/06 >>   Dose adjustments this admission:   Microbiology results: 10/06 BCx:   UCx:    Sputum:   10/06 MRSA PCR:   Thank you for allowing pharmacy to be a part of this patient's care.  Forrest Moron 01/19/2018 3:05 PM

## 2018-01-19 NOTE — ED Notes (Signed)
Recollect of blood sent down to lab at this time

## 2018-01-19 NOTE — Procedures (Signed)
  Procedure: [place] thora RIGHT   EBL:   minimal Complications:  none immediate  See full dictation in [facility].  Dillard Cannon MD Main # [phone] Pager  [phone]

## 2018-01-19 NOTE — ED Notes (Signed)
Patient placed on high flo nasal canula by RT

## 2018-01-19 NOTE — ED Provider Notes (Signed)
-----------------------------------------  4:12 PM on 01/19/2018 -----------------------------------------  Pleurx drainage kit obtained from radiology colleagues.  They advised this can also be obtained from central supply.  Unfortunately, when using the kit, there was minimal, approximately 1 mm of drainage return.  Chest x-ray does show significant effusion.  Suspect that catheter has become clogged or effusion has become loculated preventing drainage.  Discussed with interventional radiologist who will plan to assess for management today given the patient's ongoing respiratory distress.   Carrie Mew, MD 01/19/18 [phone]

## 2018-01-19 NOTE — H&P (Signed)
[place] [facility] [place] NAME: Gracelee Stemmler    MR#:  [id number]  DATE OF BIRTH:  11/20/1918  DATE OF ADMISSION:  01/19/2018  PRIMARY CARE PHYSICIAN: Ezequiel Kayser, MD   REQUESTING/REFERRING PHYSICIAN: Dr. Delman Kitten  CHIEF COMPLAINT:   Chief Complaint  Patient presents with  . Shortness of Breath    HISTORY OF PRESENT ILLNESS:  Tarina Volk  is a 84 y.o. female with a known history of A. fib currently off of Coumadin, history of breast cancer, hypertension who was admitted to the hospital 6 weeks ago for left pleural effusion and a Pleurx catheter was placed after drainage comes back from [facility] to long-term care facility secondary to worsening shortness of breath.  A liter of fluid was drained from her left chest about 6 weeks ago, cytology came positive for malignant cells at the time.  Family did not pursue further aggressive care about the possible malignancy underlying.  She was discharged to [facility] short-term rehab.  She did recover some of her strength and was using a walker to ambulate.  But for the last 3 weeks she is been at the long-term care, extremely weak.  Breathing is slowly getting worse and since last night could not lay flat could not breathe and so brought to the emergency room.  Chest x-ray showing bilateral large pleural effusions, pulmonary edema.  She is on 15 L oxygen at this time and still appearing very dyspneic and tachypneic.  After extensive discussion with oldest son, who is the healthcare power of attorney, and other family members, they opted for comfort measures at this time  PAST MEDICAL HISTORY:   Past Medical History:  Diagnosis Date  . A-fib ([place])   . Breast cancer ([place]) 04/20/2001   lt mastecomy  . Glaucoma   . Hyperlipidemia   . Hypertension     PAST SURGICAL HISTORY:   Past Surgical History:  Procedure Laterality Date  . ABDOMINAL HYSTERECTOMY    . IR PERC PLEURAL DRAIN W/INDWELL CATH W/IMG  GUIDE  12/12/2017  . MASTECTOMY Left 04/20/01    SOCIAL HISTORY:   Social History   Tobacco Use  . Smoking status: Never Smoker  . Smokeless tobacco: Never Used  Substance Use Topics  . Alcohol use: No    FAMILY HISTORY:   Family History  Problem Relation Age of Onset  . Breast cancer Sister   . Throat cancer Father     DRUG ALLERGIES:  No Known Allergies  REVIEW OF SYSTEMS:   Review of Systems  Constitutional: Positive for weight loss. Negative for chills, fever and malaise/fatigue.  HENT: Negative for ear discharge, ear pain, hearing loss, nosebleeds and tinnitus.   Eyes: Negative for blurred vision, double vision and photophobia.  Respiratory: Positive for shortness of breath. Negative for cough, hemoptysis and wheezing.   Cardiovascular: Positive for leg swelling. Negative for chest pain, palpitations and orthopnea.  Gastrointestinal: Negative for abdominal pain, constipation, diarrhea, melena, nausea and vomiting.  Genitourinary: Negative for dysuria, frequency and urgency.  Musculoskeletal: Negative for myalgias and neck pain.  Skin: Negative for rash.  Neurological: Negative for dizziness, tingling, tremors, sensory change, speech change, focal weakness and headaches.  Endo/Heme/Allergies: Does not bruise/bleed easily.  Psychiatric/Behavioral: Negative for depression.    MEDICATIONS AT HOME:   Prior to Admission medications   Medication Sig Start Date End Date Taking? Authorizing Provider  acetaminophen (TYLENOL) 325 MG tablet Take 650 mg by mouth every 4 (four) hours  as needed.   Yes [provider]  calcium carbonate (CALCIUM 600) 1500 (600 Ca) MG TABS tablet Take 1 tablet by mouth daily.   Yes [provider]  digoxin (LANOXIN) 0.125 MG tablet Take 0.125 mg by mouth daily. 04/23/16  Yes [provider]  diltiazem (CARDIZEM CD) 300 MG 24 hr capsule Take 300 mg by mouth daily. 10/27/15  Yes [provider]  ferrous sulfate 325  (65 FE) MG tablet Take 325 mg by mouth 2 (two) times daily.   Yes [provider]  furosemide (LASIX) 20 MG tablet Take 20 mg by mouth daily. For 1 week 01/19/18 01/26/18 Yes [provider]  latanoprost (XALATAN) 0.005 % ophthalmic solution Place 1 drop into both eyes at bedtime. 09/20/13  Yes [provider]  lisinopril-hydrochlorothiazide (PRINZIDE,ZESTORETIC) 20-12.5 MG tablet Take 1 tablet by mouth daily. 04/23/16  Yes [provider]  Multiple Vitamin (MULTI-VITAMINS) TABS Take 1 tablet by mouth daily.   Yes [provider]  Multiple Vitamins-Minerals (PRESERVISION AREDS PO) Take 1 capsule by mouth 2 (two) times daily.   Yes [provider]  potassium chloride SA (K-DUR,KLOR-CON) 20 MEQ tablet Take 20 mEq by mouth daily. 04/23/16  Yes [provider]  timolol (TIMOPTIC) 0.5 % ophthalmic solution Place 1 drop into both eyes daily.  09/20/13  Yes [provider]      VITAL SIGNS:  Blood pressure (!) 155/67, pulse 88, temperature 98.5 F (36.9 C), temperature source Axillary, resp. rate (!) 28, height 5\' 2"  (1.575 m), weight 61.2 kg, SpO2 91 %.  PHYSICAL EXAMINATION:   Physical Exam  GENERAL:  84 y.o.-year-old elderly patient lying in the bed, critically ill appearing. EYES: Pupils equal, round, reactive to light and accommodation. No scleral icterus. Extraocular muscles intact.  HEENT: Head atraumatic, normocephalic. Oropharynx and nasopharynx clear.  NECK:  Supple, no jugular venous distention. No thyroid enlargement, no tenderness.  LUNGS: very dyspneic, tachypenic, decreased breath sounds all over lung fields. no wheezing, rales,rhonchi or crepitation. No use of accessory muscles of respiration.  CARDIOVASCULAR: S1, S2 normal. No  rubs, or gallops. 2/6 systolic murmur present ABDOMEN: Soft, nontender, nondistended. Bowel sounds present. No organomegaly or mass.  EXTREMITIES: No  cyanosis, or clubbing. 2+ pedal edema  present NEUROLOGIC: Cranial nerves II through XII are intact. Muscle strength 5/5 in all extremities. Sensation intact. Gait not checked. Global weakness PSYCHIATRIC: The patient is alert  SKIN: No obvious rash, lesion, or ulcer.   LABORATORY PANEL:   CBC Recent Labs  Lab 01/19/18 1255  WBC 21.4*  HGB 13.0  HCT 40.1  PLT 393   ------------------------------------------------------------------------------------------------------------------  Chemistries  Recent Labs  Lab 01/19/18 1401  NA 136  K 4.4  CL 98  CO2 28  GLUCOSE 126*  BUN 22  CREATININE 0.65  CALCIUM 9.0  AST 33  ALT 43  ALKPHOS 28*  BILITOT 1.0   ------------------------------------------------------------------------------------------------------------------  Cardiac Enzymes Recent Labs  Lab 01/19/18 1401  TROPONINI 0.04*   ------------------------------------------------------------------------------------------------------------------  RADIOLOGY:  Dg Chest Portable 1 View  Result Date: 01/19/2018 CLINICAL DATA:  Shortness of breath EXAM: PORTABLE CHEST 1 VIEW COMPARISON:  12/09/2017 FINDINGS: Large layering bilateral effusions. Cardiomegaly with vascular congestion and bibasilar opacities which could reflect edema or atelectasis. Biapical pleural/parenchymal scarring. No acute bony abnormality. IMPRESSION: Large layering bilateral effusions. Cardiomegaly with vascular congestion and bibasilar atelectasis or edema. Electronically Signed   By: Rolm Baptise M.D.   On: 01/19/2018 13:17    EKG:   Orders  placed or performed during the hospital encounter of 01/19/18  . ED EKG  . ED EKG  . EKG 12-Lead  . EKG 12-Lead    IMPRESSION AND PLAN:   Breannah Kratt  is a 84 y.o. female with a known history of A. fib currently off of Coumadin, history of breast cancer, hypertension who was admitted to the hospital 6 weeks ago for left pleural effusion and a Pleurx catheter was placed after drainage comes back  from [facility] to long-term care facility secondary to worsening shortness of breath.  1.  Acute hypoxic respiratory failure-secondary to bilateral malignant pleural effusion reaccumulation.  1 L was drained from the left side and a Pleurx catheter reason.  Consider draining from the Pleurx catheter for symptomatic relief. -Family decided no further procedures including thoracentesis.  Will do IV Lasix for comfort if the breathing improves -Care management consult for possible transfer to hospice home tomorrow if stable -Roxanol, Ativan ordered. -2 L oxygen via nasal cannula. -No labs, vitals per floor routine -Discontinue antibiotics.  All the records are reviewed and case discussed with ED provider. Management plans discussed with the patient, family and they are in agreement.  CODE STATUS: DNR  TOTAL TIME TAKING CARE OF THIS PATIENT: 55 minutes.    Rashon Westrup M.D on 01/19/2018 at 3:05 PM  Between 7am to 6pm - Pager - [phone]  After 6pm go to [email or web address] - password [email or web address] [facility]  Office  [phone]  CC: Primary care physician; Ezequiel Kayser, MD

## 2018-01-19 NOTE — ED Provider Notes (Signed)
[facility] Emergency Department Provider Note ____________________________________________   First MD Initiated Contact with Patient 01/19/18 1412     (approximate)  I have reviewed the triage vital signs and the nursing notes.   HISTORY  Chief Complaint Shortness of Breath   EM caveat: Limited due to dyspnea, increased work of breathing  HPI Kathryn [name] is a 84 y.o. female   presents for evaluation of shortness of breath  EMS reports patient has been sent from her care facility for increased shortness of breath, was given an extra dose of Lasix yesterday and had a chest x-ray but chest x-ray result is not yet known.  Patient has a known history of fluid collection of the left lung.  Patient herself reports no pain, reports she just started feeling more short of breath over the last day.  Denies headache chest pain fevers chills cough or other concerns except she feels short of breath  Family reports that twice weekly she still having fluid taken off the left long for fluid, shortness of breath has been worsening  Past Medical History:  Diagnosis Date  . A-fib ([place])   . Breast cancer ([place]) 04/20/2001   lt mastecomy  . Glaucoma   . Hyperlipidemia   . Hypertension     Patient Active Problem List   Diagnosis Date Noted  . Pulmonary edema 01/19/2018  . Malignant pleural effusion   . Shortness of breath   . Goals of care, counseling/discussion   . Palliative care by specialist   . Acute respiratory failure ([place]) 12/07/2017  . Hypoxia 10/18/2016    Past Surgical History:  Procedure Laterality Date  . ABDOMINAL HYSTERECTOMY    . IR PERC PLEURAL DRAIN W/INDWELL CATH W/IMG GUIDE  12/12/2017  . MASTECTOMY Left 04/20/01    Prior to Admission medications   Medication Sig Start Date End Date Taking? Authorizing Provider  acetaminophen (TYLENOL) 325 MG tablet Take 650 mg by mouth every 4 (four) hours as needed.   Yes [provider]    calcium carbonate (CALCIUM 600) 1500 (600 Ca) MG TABS tablet Take 1 tablet by mouth daily.   Yes [provider]  digoxin (LANOXIN) 0.125 MG tablet Take 0.125 mg by mouth daily. 04/23/16  Yes [provider]  diltiazem (CARDIZEM CD) 300 MG 24 hr capsule Take 300 mg by mouth daily. 10/27/15  Yes [provider]  ferrous sulfate 325 (65 FE) MG tablet Take 325 mg by mouth 2 (two) times daily.   Yes [provider]  furosemide (LASIX) 20 MG tablet Take 20 mg by mouth daily. For 1 week 01/19/18 01/26/18 Yes [provider]  latanoprost (XALATAN) 0.005 % ophthalmic solution Place 1 drop into both eyes at bedtime. 09/20/13  Yes [provider]  lisinopril-hydrochlorothiazide (PRINZIDE,ZESTORETIC) 20-12.5 MG tablet Take 1 tablet by mouth daily. 04/23/16  Yes [provider]  Multiple Vitamin (MULTI-VITAMINS) TABS Take 1 tablet by mouth daily.   Yes [provider]  Multiple Vitamins-Minerals (PRESERVISION AREDS PO) Take 1 capsule by mouth 2 (two) times daily.   Yes [provider]  potassium chloride SA (K-DUR,KLOR-CON) 20 MEQ tablet Take 20 mEq by mouth daily. 04/23/16  Yes [provider]  timolol (TIMOPTIC) 0.5 % ophthalmic solution Place 1 drop into both eyes daily.  09/20/13  Yes [provider]    Allergies Patient has no known allergies.  Family History  Problem Relation Age of Onset  . Breast cancer Sister   .  Throat cancer Father     Social History Social History   Tobacco Use  . Smoking status: Never Smoker  . Smokeless tobacco: Never Used  Substance Use Topics  . Alcohol use: No  . Drug use: No    Review of Systems Constitutional: No fever/chills Eyes: No visual changes. ENT: No sore throat. Cardiovascular: Denies chest pain. Respiratory: See HPI Gastrointestinal: No abdominal pain.   Genitourinary: Negative for dysuria. Musculoskeletal: Negative for back pain. Skin: Negative for  rash. Neurological: Negative for headaches, areas of focal weakness or numbness.  EM caveat: Limited due to dyspnea    ____________________________________________   PHYSICAL EXAM:  VITAL SIGNS: ED Triage Vitals  Enc Vitals Group     BP 01/19/18 1300 (!) 158/67     Pulse Rate 01/19/18 1249 97     Resp 01/19/18 1249 (!) 35     Temp 01/19/18 1249 98.5 F (36.9 C)     Temp Source 01/19/18 1249 Axillary     SpO2 01/19/18 1241 (!) 81 %     Weight 01/19/18 1250 135 lb (61.2 kg)     Height 01/19/18 1250 5\' 2"  (1.575 m)     Head Circumference --      Peak Flow --      Pain Score 01/19/18 1250 0     Pain Loc --      Pain Edu? --      Excl. in [place]? --     Constitutional: Alert and oriented. Well appearing and in no acute distress. Eyes: Conjunctivae are normal. Head: Atraumatic. Nose: No congestion/rhinnorhea. Mouth/Throat: Mucous membranes are moist. Neck: No stridor.  Cardiovascular: Normal rate, regular rhythm. Grossly normal heart sounds.  Good peripheral circulation. Respiratory: Moderate increased work of breathing, low 90s on nonrebreather mask.  Rales and decreased lung sounds over the lungs bilaterally lower lobes.  Upper apices appear clear.  No wheezing.  Left chest Pleurx catheter clean dry and intact. Gastrointestinal: Soft and nontender. No distention. Musculoskeletal: No lower extremity tenderness nor edema. Neurologic:  Normal speech and language. No gross focal neurologic deficits are appreciated.  Skin:  Skin is warm, dry and intact. No rash noted. Psychiatric: Mood and affect are normal. Speech and behavior are normal.  ____________________________________________   LABS (all labs ordered are listed, but only abnormal results are displayed)  Labs Reviewed  CBC WITH DIFFERENTIAL/PLATELET - Abnormal; Notable for the following components:      Result Value   WBC 21.4 (*)    Neutro Abs 18.8 (*)    Lymphs Abs 0.8 (*)    Monocytes Absolute 1.5 (*)    All  other components within normal limits  COMPREHENSIVE METABOLIC PANEL - Abnormal; Notable for the following components:   Glucose, Bld 126 (*)    Albumin 2.6 (*)    Alkaline Phosphatase 28 (*)    All other components within normal limits  TROPONIN I - Abnormal; Notable for the following components:   Troponin I 0.04 (*)    All other components within normal limits  CULTURE, BLOOD (ROUTINE X 2)  CULTURE, BLOOD (ROUTINE X 2)  LACTIC ACID, PLASMA  LIPASE, BLOOD  PROTIME-INR  URINALYSIS, ROUTINE W REFLEX MICROSCOPIC   ____________________________________________  EKG  Reviewed and entered by me at 1300 Heart rate 80 QRS 80 QTc 360 Atrial fibrillation, no ischemic changes noted. ____________________________________________  [id number]  Dg Chest Portable 1 View  Result Date: 01/19/2018 CLINICAL DATA:  Shortness of breath EXAM: PORTABLE CHEST 1 VIEW COMPARISON:  12/09/2017  FINDINGS: Large layering bilateral effusions. Cardiomegaly with vascular congestion and bibasilar opacities which could reflect edema or atelectasis. Biapical pleural/parenchymal scarring. No acute bony abnormality. IMPRESSION: Large layering bilateral effusions. Cardiomegaly with vascular congestion and bibasilar atelectasis or edema. Electronically Signed   By: Rolm Baptise M.D.   On: 01/19/2018 13:17    Chest x-ray reviewed, large bilateral effusions.  Personally viewed by me. ____________________________________________   PROCEDURES  Procedure(s) performed: None  Procedures  Critical Care performed: Yes, see critical care note(s)  CRITICAL CARE Performed by: Delman Kitten   Total critical care time: 40 minutes  Critical care time was exclusive of separately billable procedures and treating other patients.  Critical care was necessary to treat or prevent imminent or life-threatening deterioration.  Critical care was time spent personally by me on the following activities: development of treatment plan  with patient and/or surrogate as well as nursing, discussions with consultants, evaluation of patient's response to treatment, examination of patient, obtaining history from patient or surrogate, ordering and performing treatments and interventions, ordering and review of laboratory studies, ordering and review of radiographic studies, pulse oximetry and re-evaluation of patient's condition.  ____________________________________________   INITIAL IMPRESSION / ASSESSMENT AND PLAN / ED COURSE  Pertinent labs & imaging results that were available during my care of the patient were reviewed by me and considered in my medical decision making (see chart for details).   Patient resents for evaluation for hypoxia, dyspnea.  Noted to be hypoxic on 4 L per EMS with sats in the mid 80s.  She does appear acutely ill with respiratory distress and increased work of breathing.  Clinical Course as of Jan 19 1537  Nancy Fetter Jan 19, 2018  1404 Discussed with Dr. Marijo File. Consult placed for left pleurex drainage, urgent request.    [MQ]    Clinical Course User Index [MQ] Delman Kitten, MD    Patient appears to have worsening effusion suspect likely malignant but given her notable leukocytosis and worsening course would also strongly consider possibility of overlying infection, treat with empiric antibiotics.  Interventional radiology planning to evaluate patient this afternoon for drainage of left pleural effusion from her existing Pleurx catheter.  ----------------------------------------- 3:39 PM on 01/19/2018 -----------------------------------------  Patient does appear improved, oxygenating with more comfortable breathing on nasal cannula high flow at this time.  Maintaining saturations in the low 90s on 15 L.  And patient reports improvement.  Will admit for further evaluation, patient has increased oxygen needs currently tolerating high flow nasal cannula well.   Patient has been seen and admitted  by hospitalist service at this time.  Awaiting bed in hospital, ongoing care and report as the patient awaits admission given to Dr. Joni Fears. ____________________________________________   FINAL CLINICAL IMPRESSION(S) / ED DIAGNOSES  Final diagnoses:  Acute hypoxemic respiratory failure ([employer])  Bilateral pleural effusion        Note:  This document was prepared using Dragon voice recognition software and may include unintentional dictation errors       Delman Kitten, MD 01/19/18 1539

## 2018-01-20 DIAGNOSIS — Z515 Encounter for palliative care: Secondary | ICD-10-CM

## 2018-01-20 DIAGNOSIS — Z66 Do not resuscitate: Secondary | ICD-10-CM

## 2018-01-20 DIAGNOSIS — R0682 Tachypnea, not elsewhere classified: Secondary | ICD-10-CM

## 2018-01-20 DIAGNOSIS — Z7189 Other specified counseling: Secondary | ICD-10-CM

## 2018-01-20 DIAGNOSIS — R0603 Acute respiratory distress: Secondary | ICD-10-CM

## 2018-01-20 DIAGNOSIS — J90 Pleural effusion, not elsewhere classified: Secondary | ICD-10-CM

## 2018-01-20 DIAGNOSIS — J9601 Acute respiratory failure with hypoxia: Principal | ICD-10-CM

## 2018-01-20 MED ORDER — LORAZEPAM 2 MG/ML IJ SOLN: 1.0000 mg | Freq: Four times a day (QID) | INTRAMUSCULAR | 0 refills | Status: AC | PRN

## 2018-01-20 MED ORDER — ONDANSETRON HCL 4 MG/2ML IJ SOLN: 4.0000 mg | Freq: Four times a day (QID) | INTRAMUSCULAR | 0 refills | Status: AC | PRN

## 2018-01-20 MED ORDER — POLYVINYL ALCOHOL 1.4 % OP SOLN: 1.0000 [drp] | Freq: Four times a day (QID) | OPHTHALMIC | Status: DC | PRN

## 2018-01-20 MED ORDER — GLYCOPYRROLATE 1 MG PO TABS: 1.0000 mg | ORAL_TABLET | ORAL | Status: AC | PRN

## 2018-01-20 MED ORDER — GLYCOPYRROLATE 0.2 MG/ML IJ SOLN: 0.2000 mg | INTRAMUSCULAR | Status: AC | PRN

## 2018-01-20 MED ORDER — MORPHINE SULFATE (CONCENTRATE) 10 MG/0.5ML PO SOLN: 10.0000 mg | ORAL | Status: AC | PRN

## 2018-01-20 MED ORDER — GLYCOPYRROLATE 0.2 MG/ML IJ SOLN
0.2000 mg | INTRAMUSCULAR | Status: DC | PRN
  Filled 2018-01-20: qty 1

## 2018-01-20 MED ORDER — ONDANSETRON HCL 4 MG PO TABS: 4.0000 mg | ORAL_TABLET | Freq: Four times a day (QID) | ORAL | 0 refills | Status: AC | PRN

## 2018-01-20 MED ORDER — GLYCOPYRROLATE 1 MG PO TABS
1.0000 mg | ORAL_TABLET | ORAL | Status: DC | PRN
  Filled 2018-01-20: qty 1

## 2018-01-20 MED ORDER — BIOTENE DRY MOUTH MT LIQD: 15.0000 mL | OROMUCOSAL | Status: DC | PRN

## 2018-01-20 MED ORDER — FUROSEMIDE 10 MG/ML IJ SOLN: 40.0000 mg | Freq: Every day | INTRAMUSCULAR | 0 refills | Status: AC

## 2018-01-20 NOTE — Progress Notes (Signed)
Please note patient is currently followed by outpatient Palliative. CSW Shela Leff made aware. Flo Shanks RN, BSN, [facility] [place], hospital liaison [phone]

## 2018-01-20 NOTE — Discharge Summary (Signed)
[facility] [place] NAME: Kathryn [name]    MR#:  [id number]  DATE OF BIRTH:  07-24-1918  DATE OF ADMISSION:  01/19/2018 ADMITTING PHYSICIAN: Gladstone Lighter, MD  DATE OF DISCHARGE: 01/20/18  PRIMARY CARE PHYSICIAN: Ezequiel Kayser, MD    ADMISSION DIAGNOSIS:  Tachypnea [R06.82] Respiratory distress [R06.03] Bilateral pleural effusion [J90] Acute hypoxemic respiratory failure ([place]) [J96.01]  DISCHARGE DIAGNOSIS:  Active Problems:   Pulmonary edema   SECONDARY DIAGNOSIS:   Past Medical History:  Diagnosis Date  . A-fib ([place])   . Breast cancer ([place]) 04/20/2001   lt mastecomy  . Glaucoma   . Hyperlipidemia   . Hypertension     HOSPITAL COURSE:   hpi  Kathryn [name]  is a 84 y.o. female with a known history of A. fib currently off of Coumadin, history of breast cancer, hypertension who was admitted to the hospital 6 weeks ago for left pleural effusion and a Pleurx catheter was placed after drainage comes back from [facility] to long-term care facility secondary to worsening shortness of breath.  A liter of fluid was drained from her left chest about 6 weeks ago, cytology came positive for malignant cells at the time.  Family did not pursue further aggressive care about the possible malignancy underlying.  She was discharged to [facility] short-term rehab.  She did recover some of her strength and was using a walker to ambulate.  But for the last 3 weeks she is been at the long-term care, extremely weak.  Breathing is slowly getting worse and since last night could not lay flat could not breathe and so brought to the emergency room.  Chest x-ray showing bilateral large pleural effusions, pulmonary edema.  She is on 15 L oxygen at this time and still appearing very dyspneic and tachypneic.  After extensive discussion with oldest son, who is the healthcare power of attorney, and other family members, they opted for comfort measures at this  time  1.  Acute hypoxic respiratory failure-secondary to bilateral malignant pleural effusion reaccumulation.  1 L was drained from the left side and a Pleurx catheter reason.  Consider draining from the Pleurx catheter for symptomatic relief. -Family decided no further procedures including thoracentesis.   -Patient seen by palliative care - transfer to hospice home today -Roxanol, Ativan ordered. -2 L oxygen via nasal cannula. -No labs, vitals per floor routine -Discontinued antibiotics.  DISCHARGE CONDITIONS:   guarded  CONSULTS OBTAINED:     PROCEDURES none  DRUG ALLERGIES:  No Known Allergies  DISCHARGE MEDICATIONS:   Allergies as of 01/20/2018   No Known Allergies     Medication List    STOP taking these medications   CALCIUM 600 1500 (600 Ca) MG Tabs tablet Generic drug:  calcium carbonate   digoxin 0.125 MG tablet Commonly known as:  LANOXIN   diltiazem 300 MG 24 hr capsule Commonly known as:  CARDIZEM CD   ferrous sulfate 325 (65 FE) MG tablet   furosemide 20 MG tablet Commonly known as:  LASIX Replaced by:  furosemide 10 MG/ML injection   lisinopril-hydrochlorothiazide 20-12.5 MG tablet Commonly known as:  PRINZIDE,ZESTORETIC   MULTI-VITAMINS Tabs   potassium chloride SA 20 MEQ tablet Commonly known as:  K-DUR,KLOR-CON   PRESERVISION AREDS PO     TAKE these medications   acetaminophen 325 MG tablet Commonly known as:  TYLENOL Take 650 mg by mouth every 4 (four) hours as needed.   furosemide 10 MG/ML injection  Commonly known as:  LASIX Inject 4 mLs (40 mg total) into the vein daily. Start taking on:  01/21/2018 Replaces:  furosemide 20 MG tablet   glycopyrrolate 1 MG tablet Commonly known as:  ROBINUL Take 1 tablet (1 mg total) by mouth every 4 (four) hours as needed (excessive secretions).   glycopyrrolate 0.2 MG/ML injection Commonly known as:  ROBINUL Inject 1 mL (0.2 mg total) into the vein every 4 (four) hours as needed (excessive  secretions).   latanoprost 0.005 % ophthalmic solution Commonly known as:  XALATAN Place 1 drop into both eyes at bedtime.   LORazepam 2 MG/ML injection Commonly known as:  ATIVAN Inject 0.5 mLs (1 mg total) into the vein every 6 (six) hours as needed for anxiety.   morphine CONCENTRATE 10 MG/0.5ML Soln concentrated solution Take 0.5 mLs (10 mg total) by mouth every 2 (two) hours as needed for moderate pain, severe pain, anxiety or shortness of breath.   ondansetron 4 MG tablet Commonly known as:  ZOFRAN Take 1 tablet (4 mg total) by mouth every 6 (six) hours as needed for nausea.   ondansetron 4 MG/2ML Soln injection Commonly known as:  ZOFRAN Inject 2 mLs (4 mg total) into the vein every 6 (six) hours as needed for nausea.   timolol 0.5 % ophthalmic solution Commonly known as:  TIMOPTIC Place 1 drop into both eyes daily.        DISCHARGE INSTRUCTIONS:   Transfer patient to hospice home  DIET:  Regular diet  DISCHARGE CONDITION:  Fair  ACTIVITY:  Bedrest  OXYGEN:  Home Oxygen: Yes.      Oxygen Delivery: as needed   DISCHARGE LOCATION:  Transfer patient to hospice home    If you experience worsening of your admission symptoms, develop shortness of breath, life threatening emergency, suicidal or homicidal thoughts you must seek medical attention immediately by calling 911 or calling your MD immediately  if symptoms less severe.  You Must read complete instructions/literature along with all the possible adverse reactions/side effects for all the Medicines you take and that have been prescribed to you. Take any new Medicines after you have completely understood and accpet all the possible adverse reactions/side effects.   Please note  You were cared for by a hospitalist during your hospital stay. If you have any questions about your discharge medications or the care you received while you were in the hospital after you are discharged, you can call the unit and  asked to speak with the hospitalist on call if the hospitalist that took care of you is not available. Once you are discharged, your primary care physician will handle any further medical issues. Please note that NO REFILLS for any discharge medications will be authorized once you are discharged, as it is imperative that you return to your primary care physician (or establish a relationship with a primary care physician if you do not have one) for your aftercare needs so that they can reassess your need for medications and monitor your lab values.     Today  Chief Complaint  Patient presents with  . Shortness of Breath   Patient is resting okay.  Family at bedside  ROS:  Constitutional: Positive for weight loss. Negative for chills, fever and malaise/fatigue.  HENT: Negative for ear discharge, ear pain, hearing loss, nosebleeds and tinnitus.   Eyes: Negative for blurred vision, double vision and photophobia.  Respiratory: Positive for shortness of breath. Negative for cough, hemoptysis and wheezing.   Cardiovascular:  Positive for leg swelling. Negative for chest pain, palpitations and orthopnea.  Gastrointestinal: Negative for abdominal pain, constipation, diarrhea, melena, nausea and vomiting.  Genitourinary: Negative for dysuria, frequency and urgency.  Musculoskeletal: Negative for myalgias and neck pain.  Skin: Negative for rash.  Neurological: Negative for dizziness, tingling, tremors, sensory change, speech change, focal weakness and headaches.  Endo/Heme/Allergies: Does not bruise/bleed easily.  Psychiatric/Behavioral: Negative for depression.   VITAL SIGNS:  Blood pressure 130/65, pulse 68, temperature 97.9 F (36.6 C), temperature source Oral, resp. rate (!) 23, height 5\' 2"  (1.575 m), weight 61.2 kg, SpO2 90 %.  I/O:    Intake/Output Summary (Last 24 hours) at 01/20/2018 1541 Last data filed at 01/20/2018 0948 Gross per 24 hour  Intake 600 ml  Output 250 ml  Net 350 ml     PHYSICAL EXAMINATION:  GENERAL:  84 y.o.-year-old patient lying in the bed with no acute distress.  EYES: Pupils equal, round, reactive to light and accommodation. No scleral icterus. Extraocular muscles intact.  HEENT: Head atraumatic, normocephalic. Oropharynx and nasopharynx clear.  NECK:  Supple, no jugular venous distention. No thyroid enlargement, no tenderness.  LUNGS: Moderately diminished breath sounds bilaterally, Pleurx catheter bilaterally  No use of accessory muscles of respiration.  CARDIOVASCULAR: S1, S2 normal. No murmurs, rubs, or gallops.  ABDOMEN: Soft, non-tender, non-distended. Bowel sounds present. No organomegaly or mass.  EXTREMITIES: No pedal edema, cyanosis, or clubbing.  NEUROLOGIC:  Sensation intact. Gait not checked.  PSYCHIATRIC: The patient is alert   SKIN: No obvious rash, lesion, or ulcer.   DATA REVIEW:   CBC Recent Labs  Lab 01/19/18 1255  WBC 21.4*  HGB 13.0  HCT 40.1  PLT 393    Chemistries  Recent Labs  Lab 01/19/18 1401  NA 136  K 4.4  CL 98  CO2 28  GLUCOSE 126*  BUN 22  CREATININE 0.65  CALCIUM 9.0  AST 33  ALT 43  ALKPHOS 28*  BILITOT 1.0    Cardiac Enzymes Recent Labs  Lab 01/19/18 1401  TROPONINI 0.04*    Microbiology Results  Results for orders placed or performed during the hospital encounter of 01/19/18  Blood Culture (routine x 2)     Status: None (Preliminary result)   Collection Time: 01/19/18  2:51 PM  Result Value Ref Range Status   Specimen Description BLOOD RAC  Final   Special Requests   Final    BOTTLES DRAWN AEROBIC AND ANAEROBIC Blood Culture adequate volume   Culture   Final    NO GROWTH < 24 HOURS Performed at [facility], [address] [place], [place] [address]    Report Status PENDING  Incomplete  Blood Culture (routine x 2)     Status: None (Preliminary result)   Collection Time: 01/19/18  2:56 PM  Result Value Ref Range Status   Specimen Description BLOOD LAC  Final    Special Requests   Final    BOTTLES DRAWN AEROBIC AND ANAEROBIC Blood Culture adequate volume   Culture   Final    NO GROWTH < 24 HOURS Performed at [facility], [address] [place], [place] [address]    Report Status PENDING  Incomplete  MRSA PCR Screening     Status: None   Collection Time: 01/19/18  6:30 PM  Result Value Ref Range Status   MRSA by PCR NEGATIVE NEGATIVE Final    Comment:        The GeneXpert MRSA Assay (FDA approved for  NASAL specimens only), is one component of a comprehensive MRSA colonization surveillance program. It is not intended to diagnose MRSA infection nor to guide or monitor treatment for MRSA infections. Performed at [facility], [address] [place], [place] [address]     RADIOLOGY:  Dg Chest Port 1 View  Result Date: 01/19/2018 CLINICAL DATA:  Post right thoracentesis EXAM: PORTABLE CHEST 1 VIEW COMPARISON:  01/19/2018 at 1253 hours FINDINGS: Small right pleural effusion, decreased status post thoracentesis. No pneumothorax is seen. Moderate left pleural effusion with chronic apical pleural-parenchymal scarring. The heart is normal in size. IMPRESSION: Small right pleural effusion, decreased status post thoracentesis. No pneumothorax is seen. Otherwise, no interval change. Electronically Signed   By: Julian Hy M.D.   On: 01/19/2018 17:14   Dg Chest Portable 1 View  Result Date: 01/19/2018 CLINICAL DATA:  Shortness of breath EXAM: PORTABLE CHEST 1 VIEW COMPARISON:  12/09/2017 FINDINGS: Large layering bilateral effusions. Cardiomegaly with vascular congestion and bibasilar opacities which could reflect edema or atelectasis. Biapical pleural/parenchymal scarring. No acute bony abnormality. IMPRESSION: Large layering bilateral effusions. Cardiomegaly with vascular congestion and bibasilar atelectasis or edema. Electronically Signed   By: Rolm Baptise M.D.   On: 01/19/2018 13:17    EKG:   Orders placed or performed  during the hospital encounter of 01/19/18  . EKG 12-Lead  . EKG 12-Lead  . EKG      Management plans discussed with the patient, family and they are in agreement.  Patient will be transferred to hospice home today  CODE STATUS:     Code Status Orders  (From admission, onward)         Start     Ordered   01/20/18 1303  Do not attempt resuscitation (DNR)  Continuous    Question Answer Comment  In the event of cardiac or respiratory ARREST Do not call a "code blue"   In the event of cardiac or respiratory ARREST Do not perform Intubation, CPR, defibrillation or ACLS   In the event of cardiac or respiratory ARREST Use medication by any route, position, wound care, and other measures to relive pain and suffering. May use oxygen, suction and manual treatment of airway obstruction as needed for comfort.      01/20/18 1304        Code Status History    Date Active Date Inactive Code Status Order ID Comments User Context   01/19/2018 1721 01/20/2018 1304 DNR [id number]  Gladstone Lighter, MD ED   12/07/2017 1301 12/13/2017 2002 DNR [id number]  Dustin Flock, MD Inpatient   10/18/2016 1511 10/19/2016 1931 DNR [id number]  Hillary Bow, MD ED    Advance Directive Documentation     Most Recent Value  Type of Advance Directive  Healthcare Power of Attorney, Living will  Pre-existing out of facility DNR order (yellow form or pink MOST form)  Yellow form placed in chart (order not valid for inpatient use)  "MOST" Form in Place?  -      TOTAL TIME TAKING CARE OF THIS PATIENT: 41 minutes.   Note: This dictation was prepared with Dragon dictation along with smaller phrase technology. Any transcriptional errors that result from this process are unintentional.   @[facility] @  on 01/20/2018 at 3:41 PM  Between 7am to 6pm - Pager - [phone]  After 6pm go to [email or web address] - password [email or web address] [facility]  [facility]  Office  [phone]  CC: Primary care physician; Ezequiel Kayser,  MD

## 2018-01-20 NOTE — Progress Notes (Signed)
New hospice home referral received from CSW Monica Marra following a Palliative Medicine consult. Patient is a 84 year old woman with a past medical history of left breast ca s/p mastectomy, A fib, HTN and malignant pleural effusion (8/19). She was admitted to [facility] on 10/6 from [facility] with worsening dyspnea. In the ED chest xray revealed bilateral pleural effusions. She underwent a thoracentesis on the right, pleurex catheter placed to the left at previous admission, per chart note review, this was unable to drain in the ED. Patient required supplemental oxygen at 5 liters via nasal cannula. Family and patient declined any further interventions and chose to focus on comfort. Palliative Medicine was consulted and met with patient and her family today. They have chosen to continue comfort focus care with transfer to the hospice home.  °Writer met in the family room with patient's 2 sons and 2 daughters to initiate education regarding hospice services, philosophy and team approach to care with understanding voiced. Questions answered, consents signed. Patient information faxed to referral. Report called to the hospice home, EMS notified for transport. Hospital care team updated.  °Patient seen lying in bed, continues on 5 liters of oxygen via nasal cannula. She awakened to voice and was able to answer questions, denied pain, appeared to have some dyspnea with conversation. Per family and chart note review she is eating only bites, not taking any oral medications, is now incontinent of bowel and bladder and is bed bound. PPS 20%. Patient back to resting after visit, family at bedside. Thank you for the opportunity to be involved in the care of this patient and her family. °Karen Robertson RN, BSN, CHPN °[facility], hospital liaison °[phone] °

## 2018-01-20 NOTE — Consult Note (Signed)
Consultation Note Date: 01/20/2018   Patient Name: Kathryn [name]  DOB: [birth year]  MRN: [id number]  Age / Sex: 84 y.o., female  PCP: Ezequiel Kayser, MD Referring Physician: Nicholes Mango, MD  Reason for Consultation: Establishing goals of care  HPI/Patient Profile: 84 y.o. female admitted on 01/19/2018 from [facility] with complaints of shortness of breath. She has a past medical history of left breast cancer s/p mastectomy, hypertension, atrial fibrillation, hard of hearing, glaucoma, hyperlipidemia, and malignant pleural effusion. Patient was admitted back on 8/24 for left pleural effusion and had a Pleurx catheter placed. She was then discharged to [facility] for long-term care. During her last admission she had 1.0 liter of fluid drained from left chest and cytology was positive for underlying malignancy. Family reported that last 3 weeks she has become weaker and worsening shortness of breath. Family reported patient could not lay flat and complained she could not breath the night before admission. During her ED course Chest x-ray showed bilateral large pleural effusions and pulmonary edema. She was placed on 15L oxygen and remained dyspneic and tachypneic.  Albumin 2.6. Troponin 0.04. WBC 21.4. Family confirmed DNR/DNI and refused aggressive interventions. Palliative Medicine team consulted for goals of care.   Clinical Assessment and Goals of Care: I have reviewed medical records including lab results, imaging, Epic notes, and MAR, received report from the bedside RN, and assessed the patient. I then met at the bedside with patient, and her children, Kathryn [name], and Kathryn [name]  to discuss diagnosis prognosis, GOC, EOL wishes, disposition and options. Patient is awake and alert. She is very hard of hearing but was able to engage in goals of care discussion, although she diverted most of her responses to her  children.   I introduced Palliative Medicine as specialized medical care for people living with serious illness. It focuses on providing relief from the symptoms and stress of a serious illness. The goal is to improve quality of life for both the patient and the family.  We discussed a brief life review of the patient. Kathryn [name] has been a resident at [facility] independent living for more than 12 years after her husband passed away. She has 4 children. She enjoys spending time with her family and friends. She is Nurse, learning disability.   As far as functional and nutritional status family states they have noticed a continuous decline in her health over the past few months with a more severe decline in the past few weeks. She has become weaker, sleeping more during the day, having increased shortness of breath, decrease in appetite, and just not herself. Patient states "I am tired, I am 89 and I am ready to die!" Family tearful during her statement and report she has been telling them this for a few days now. She has informed them she doesn't want to keep coming back and forth to the hospital.   We discussed her current illness and what it means in the larger context of her on-going co-morbidities.  Natural disease trajectory and expectations  at EOL were discussed. We discussed previous admission and findings of malignant cells in pleural fluid. We discussed her having large bilateral pleural fluids currently and the use of Pleurx and thoracentesis. Family and patient reports they are not interested in further interventions. They request to continue to use left pleurx as needed based on symptoms, however, they would like her to be comfortable. Patient also verbalizes she wants to be kept comfortable with no procedures and her preference is to not pass away in the hospital if possible. Support was given.   I attempted to elicit values and goals of care important to the patient and her family.   The difference between  aggressive medical intervention and comfort care was considered in light of the patient's goals of care. We discussed in detail what comfort care would look like. Patient would not have lab draws, radiology testing, vital signs, or medications not focused on comfort. Patient would receive care and medications to make sure she is kept comfortable as possible when needed for symptoms such as pain, discomfort, shortness of breath, anxiety, or nausea. Patient and family educated that patient could continue to eat comfort foods if she is able. Patient and family verbalized understanding and appreciation. Patient expressed this is what she would want and family agreed.   Family and patient confirmed DNR/DNI status and their wishes not to continue with hospitalizations.   Hospice services outpatient were explained and offered given patient's expressed goals of comfort end-of-life care only. She request not to return to [facility] if possible and would like to be placed at a residential hospice facility if available. Family verbalized this as their wishes also. We discussed the option of patient also being cared for in the home and they all agreed this would not be obtainable due to other health concerns of family and emotionally.   Family all live in [place] area and express their goal would be to have her placed near there. I explained that I would notify our Education officer, museum and she would contact area facilities for availability. I also discussed with family if no bed was available they may should consider [place] location. They agreed if no availability close to them they would be open to [place].   Questions and concerns were addressed.  Hard Choices booklet left for review. The family was encouraged to call with questions or concerns.  PMT will continue to support holistically.   HCPOA, Son [place]    DNR/DNI-as confirmed by patient/family     Comfort Care for EOL   Family/Patient requesting transfer to hospice home if available  CSW referral for residential hospice. Colletta [place], CM aware.   Continue with morphine prn for pain/shortness of breath  Zofran prn for nausea  Ativan prn for anxiety  Robinul prn for excessive secretions  Tylenol prn for fever  PMT will continue to support patient, family, and medical team during hospitalization.   Code Status/Advance Care Planning:  DNR   Symptom Management:   Morphine prn for pain/shortness of breath  Zofran prn for nausea  Ativan prn for anxiety  Robinul prn for excessive secretions  Tylenol prn for fever  Palliative Prophylaxis:   Aspiration, Bowel Regimen, Delirium Protocol, Frequent Pain Assessment and Oral Care  Additional Recommendations (Limitations, Scope, Preferences):  Full Comfort Care  Psycho-social/Spiritual:   Desire for further Chaplaincy support:no  Prognosis:   < 2 weeks-in the setting of reoccurring pleural effusions (malignancy cytology) family request  no further work-up, bilateral large pleural effusions, decreased mobility, deconditioning, protein calorie malnutrition, weight loss, pulmonary edema, hypoxia, shortness of breath, hypertension, hx of left breast ca, glaucoma, a-fib, hard of hearing, and full comfort care.   Discharge Planning: Hospice facility      Primary Diagnoses: Present on Admission: . Pulmonary edema   I have reviewed the medical record, interviewed the patient and family, and examined the patient. The following aspects are pertinent.  Past Medical History:  Diagnosis Date  . A-fib ([place])   . Breast cancer ([place]) 04/20/2001   lt mastecomy  . Glaucoma   . Hyperlipidemia   . Hypertension    Social History   Socioeconomic History  . Marital status: Widowed    Spouse name: Not on file  . Number of children: Not on file  . Years of education: Not on file  . Highest education level: Not on file   Occupational History  . Not on file  Social Needs  . Financial resource strain: Not on file  . Food insecurity:    Worry: Not on file    Inability: Not on file  . Transportation needs:    Medical: Not on file    Non-medical: Not on file  Tobacco Use  . Smoking status: Never Smoker  . Smokeless tobacco: Never Used  Substance and Sexual Activity  . Alcohol use: No  . Drug use: No  . Sexual activity: Not on file  Lifestyle  . Physical activity:    Days per week: Not on file    Minutes per session: Not on file  . Stress: Not on file  Relationships  . Social connections:    Talks on phone: Not on file    Gets together: Not on file    Attends religious service: Not on file    Active member of club or organization: Not on file    Attends meetings of clubs or organizations: Not on file    Relationship status: Not on file  Other Topics Concern  . Not on file  Social History Narrative  . Not on file   Family History  Problem Relation Age of Onset  . Breast cancer Sister   . Throat cancer Father    Scheduled Meds: . furosemide  40 mg Intravenous Daily  . latanoprost  1 drop Both Eyes QHS  . timolol  1 drop Both Eyes Daily   Continuous Infusions: PRN Meds:.acetaminophen **OR** acetaminophen, LORazepam, morphine CONCENTRATE, ondansetron **OR** ondansetron (ZOFRAN) IV Medications Prior to Admission:  Prior to Admission medications   Medication Sig Start Date End Date Taking? Authorizing Provider  acetaminophen (TYLENOL) 325 MG tablet Take 650 mg by mouth every 4 (four) hours as needed.   Yes [provider]  calcium carbonate (CALCIUM 600) 1500 (600 Ca) MG TABS tablet Take 1 tablet by mouth daily.   Yes [provider]  digoxin (LANOXIN) 0.125 MG tablet Take 0.125 mg by mouth daily. 04/23/16  Yes [provider]  diltiazem (CARDIZEM CD) 300 MG 24 hr capsule Take 300 mg by mouth daily. 10/27/15  Yes [provider]  ferrous sulfate 325 (65 FE)  MG tablet Take 325 mg by mouth 2 (two) times daily.   Yes [provider]  furosemide (LASIX) 20 MG tablet Take 20 mg by mouth daily. For 1 week 01/19/18 01/26/18 Yes [provider]  latanoprost (XALATAN) 0.005 % ophthalmic solution Place 1 drop into both eyes at bedtime. 09/20/13  Yes [provider]  lisinopril-hydrochlorothiazide (PRINZIDE,ZESTORETIC) 20-12.5 MG tablet Take 1 tablet by mouth daily. 04/23/16  Yes [provider]  Multiple Vitamin (MULTI-VITAMINS) TABS Take 1 tablet by mouth daily.   Yes [provider]  Multiple Vitamins-Minerals (PRESERVISION AREDS PO) Take 1 capsule by mouth 2 (two) times daily.   Yes [provider]  potassium chloride SA (K-DUR,KLOR-CON) 20 MEQ tablet Take 20 mEq by mouth daily. 04/23/16  Yes [provider]  timolol (TIMOPTIC) 0.5 % ophthalmic solution Place 1 drop into both eyes daily.  09/20/13  Yes [provider]   No Known Allergies Review of Systems  Constitutional: Positive for activity change, appetite change and fatigue.  Respiratory: Positive for cough and shortness of breath.   Musculoskeletal: Positive for gait problem.  Neurological: Positive for weakness.  All other systems reviewed and are negative.   Physical Exam  Constitutional: She is oriented to person, place, and time. Vital signs are normal. She is cooperative. She appears ill.  Thin, frail, chronically ill appearing, hard of hearing    Cardiovascular: Normal rate, regular rhythm, normal heart sounds and normal pulses.  Pulmonary/Chest: Accessory muscle usage present. Tachypnea noted. She has decreased breath sounds.  2L/[place]   Abdominal: Normal appearance. Bowel sounds are decreased.  Musculoskeletal:  Generalized weakness   Neurological: She is alert and oriented to person, place, and time.  Skin: Skin is warm and dry.  Psychiatric: She has a normal mood and affect. Her speech is normal and behavior is normal.  Judgment normal. Cognition and memory are normal.  Nursing note and vitals reviewed.   Vital Signs: BP 130/65 (BP Location: Right Arm)   Pulse 68   Temp 97.9 F (36.6 C) (Oral)   Resp (!) 23   Ht _0  (1.575 m)   Wt 61.2 kg   SpO2 90%   BMI 24.69 kg/m  Pain Scale: 0-10   Pain Score: 0-No pain   SpO2: SpO2: 90 % O2 Device:SpO2: 90 % O2 Flow Rate: .O2 Flow Rate (L/min): 5 L/min  IO: Intake/output summary:   Intake/Output Summary (Last 24 hours) at 01/20/2018 1254 Last data filed at 01/20/2018 0948 Gross per 24 hour  Intake 600 ml  Output 250 ml  Net 350 ml    LBM: Last BM Date: 01/20/18 Baseline Weight: Weight: 61.2 kg Most recent weight: Weight: 61.2 kg     Palliative Assessment/Data: PPS 20%   Time In: 1100 Time Out: 1230 Time Total: 90 min.   Greater than 50%  of this time was spent counseling and coordinating care related to the above assessment and plan.  Signed by:  Alda Lea, AGPCNP-BC Palliative Medicine Team  Phone: [phone] Fax: [phone] Pager: [phone] Amion: Bjorn Pippin    Please contact Palliative Medicine Team phone at [phone] for questions and concerns.  For individual provider: See Shea Evans

## 2018-01-20 NOTE — Care Management (Signed)
RNCM consult entered in error for Hospice Home.  CSW is aware.  RNCM signing off.  Please re consult if indicated.

## 2018-01-20 NOTE — Progress Notes (Signed)
Kathryn [name]  A and O x 3. VSS. Pt tolerating diet well. No complaints of pain or nausea. Pt will be leaving with IV. Pt will be discharge to hospice home via EMS.    Allergies as of 01/20/2018   No Known Allergies     Medication List    STOP taking these medications   CALCIUM 600 1500 (600 Ca) MG Tabs tablet Generic drug:  calcium carbonate   digoxin 0.125 MG tablet Commonly known as:  LANOXIN   diltiazem 300 MG 24 hr capsule Commonly known as:  CARDIZEM CD   ferrous sulfate 325 (65 FE) MG tablet   furosemide 20 MG tablet Commonly known as:  LASIX Replaced by:  furosemide 10 MG/ML injection   lisinopril-hydrochlorothiazide 20-12.5 MG tablet Commonly known as:  PRINZIDE,ZESTORETIC   MULTI-VITAMINS Tabs   potassium chloride SA 20 MEQ tablet Commonly known as:  K-DUR,KLOR-CON   PRESERVISION AREDS PO     TAKE these medications   acetaminophen 325 MG tablet Commonly known as:  TYLENOL Take 650 mg by mouth every 4 (four) hours as needed.   furosemide 10 MG/ML injection Commonly known as:  LASIX Inject 4 mLs (40 mg total) into the vein daily. Start taking on:  01/21/2018 Replaces:  furosemide 20 MG tablet   glycopyrrolate 1 MG tablet Commonly known as:  ROBINUL Take 1 tablet (1 mg total) by mouth every 4 (four) hours as needed (excessive secretions).   glycopyrrolate 0.2 MG/ML injection Commonly known as:  ROBINUL Inject 1 mL (0.2 mg total) into the vein every 4 (four) hours as needed (excessive secretions).   latanoprost 0.005 % ophthalmic solution Commonly known as:  XALATAN Place 1 drop into both eyes at bedtime.   LORazepam 2 MG/ML injection Commonly known as:  ATIVAN Inject 0.5 mLs (1 mg total) into the vein every 6 (six) hours as needed for anxiety.   morphine CONCENTRATE 10 MG/0.5ML Soln concentrated solution Take 0.5 mLs (10 mg total) by mouth every 2 (two) hours as needed for moderate pain, severe pain, anxiety or shortness of breath.   ondansetron  4 MG tablet Commonly known as:  ZOFRAN Take 1 tablet (4 mg total) by mouth every 6 (six) hours as needed for nausea.   ondansetron 4 MG/2ML Soln injection Commonly known as:  ZOFRAN Inject 2 mLs (4 mg total) into the vein every 6 (six) hours as needed for nausea.   timolol 0.5 % ophthalmic solution Commonly known as:  TIMOPTIC Place 1 drop into both eyes daily.       Vitals:   01/20/18 0906 01/20/18 1127  BP: 133/60 130/65  Pulse: 87 68  Resp:    Temp:  97.9 F (36.6 C)  SpO2:  90%    Francesco Sor

## 2018-01-20 NOTE — Progress Notes (Signed)
Per Delia Chimes. Pt will be going to hospice home with IV.

## 2018-01-24 ENCOUNTER — Ambulatory Visit: Payer: Self-pay | Admitting: Urology

## 2018-01-28 LAB — CULTURE, BLOOD (ROUTINE X 2)
CULTURE: NO GROWTH
Culture: NO GROWTH
SPECIAL REQUESTS: ADEQUATE
Special Requests: ADEQUATE

## 2018-02-10 ENCOUNTER — Ambulatory Visit: Payer: Self-pay | Admitting: Urology

## 2018-05-17 DEATH — deceased

## 2018-11-14 IMAGING — CR DG CHEST 1V PORT
1 series · 1 of 1 positions shown · non-contrast
Comparison: 01/19/2018 at 7516 hours

CLINICAL DATA: Post right thoracentesis

EXAM:
PORTABLE CHEST 1 VIEW

[dg chest port 1 view]
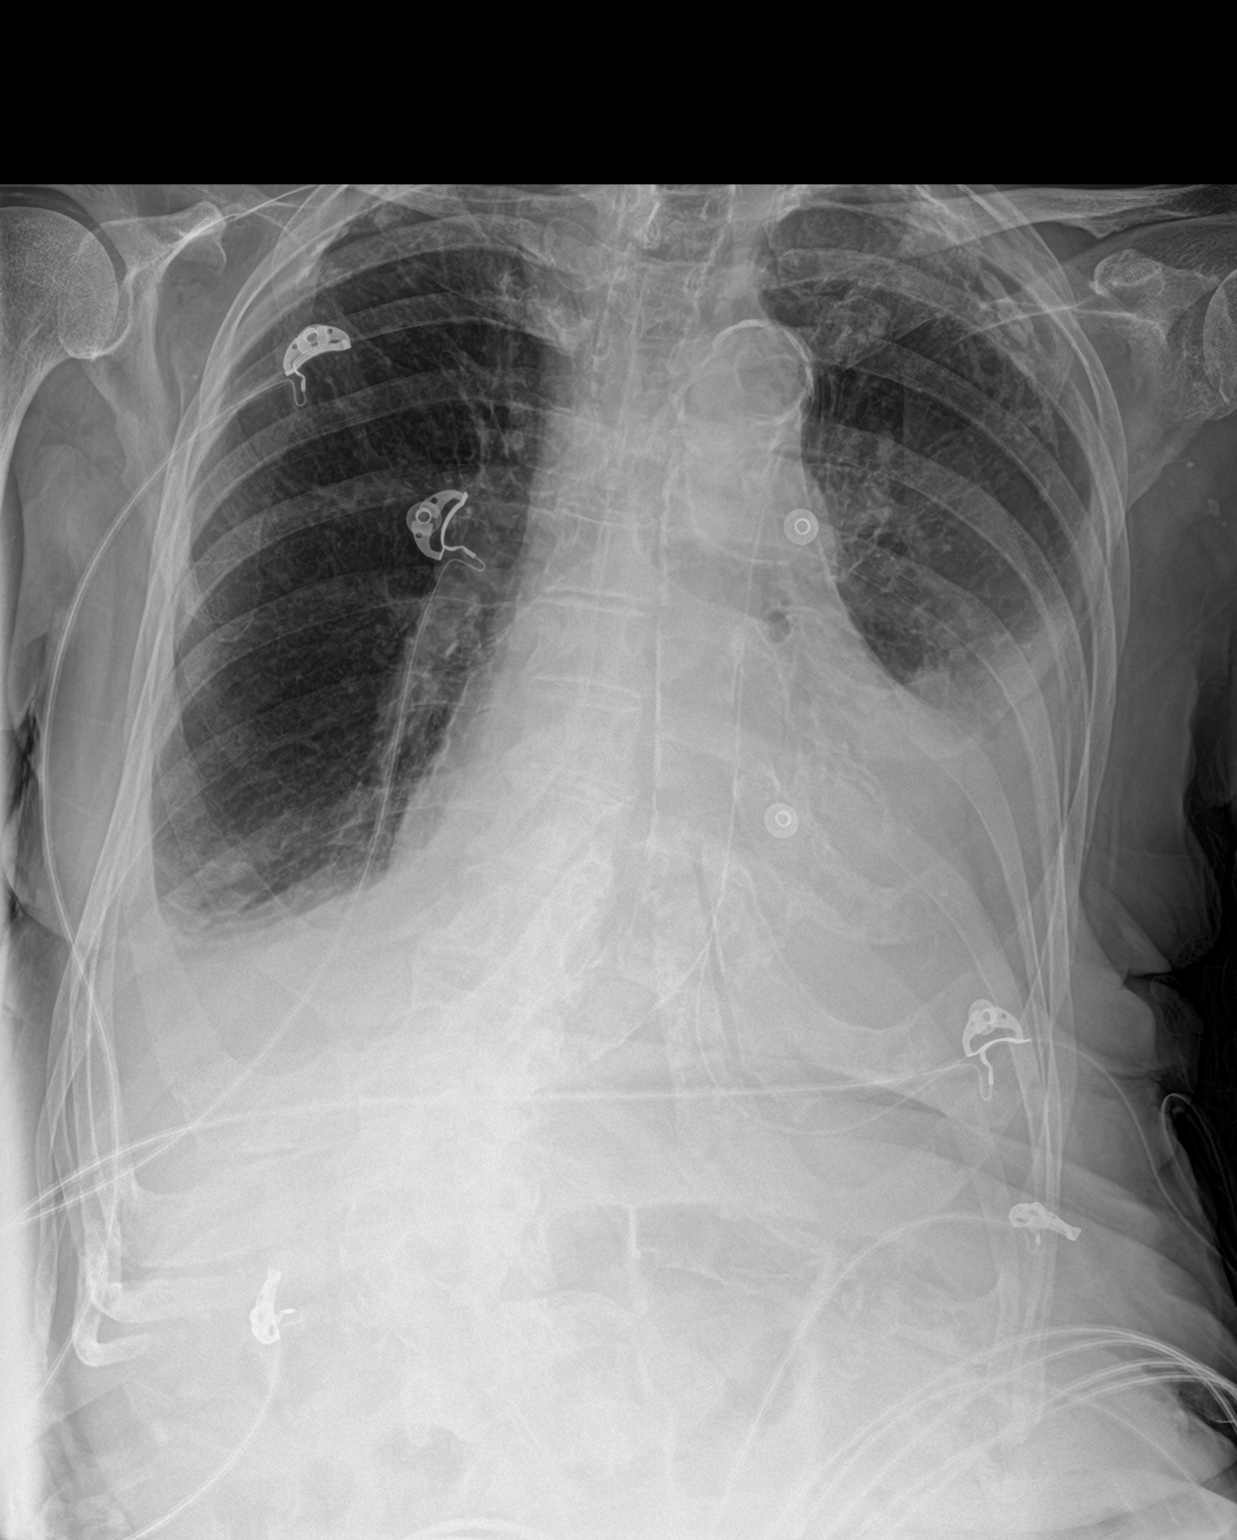

[1 of 1 positions shown; findings below may reference images not displayed]

FINDINGS: Small right pleural effusion, decreased status post thoracentesis.
No pneumothorax is seen.

Moderate left pleural effusion with chronic apical
pleural-parenchymal scarring.

The heart is normal in size.
IMPRESSION: Small right pleural effusion, decreased status post thoracentesis.
No pneumothorax is seen.

Otherwise, no interval change.
